# Patient Record
Sex: Male | Born: 1975 | Race: Black or African American | Hispanic: No | Marital: Single | Smoking: Current every day smoker
Health system: Southern US, Community
[De-identification: ages and names within clinical notes are randomized; demographics above are authoritative.]

---

## 2018-03-07 ENCOUNTER — Emergency Department (HOSPITAL_COMMUNITY): Payer: BLUE CROSS/BLUE SHIELD

## 2018-03-07 ENCOUNTER — Encounter (HOSPITAL_COMMUNITY): Payer: Self-pay | Admitting: Emergency Medicine

## 2018-03-07 ENCOUNTER — Inpatient Hospital Stay (HOSPITAL_COMMUNITY)
Admission: EM | Admit: 2018-03-07 | Discharge: 2018-03-18 | DRG: 164 | Disposition: A | Discharge status: Home / Self Care | Payer: BLUE CROSS/BLUE SHIELD | Attending: Thoracic Surgery (Cardiothoracic Vascular Surgery) | Admitting: Thoracic Surgery (Cardiothoracic Vascular Surgery)

## 2018-03-07 ENCOUNTER — Inpatient Hospital Stay (HOSPITAL_COMMUNITY): Payer: BLUE CROSS/BLUE SHIELD

## 2018-03-07 ENCOUNTER — Other Ambulatory Visit: Payer: Self-pay

## 2018-03-07 DIAGNOSIS — Z9689 Presence of other specified functional implants: Secondary | ICD-10-CM

## 2018-03-07 DIAGNOSIS — J438 Other emphysema: Principal | ICD-10-CM | POA: Diagnosis present

## 2018-03-07 DIAGNOSIS — J9811 Atelectasis: Secondary | ICD-10-CM | POA: Diagnosis present

## 2018-03-07 DIAGNOSIS — J9383 Other pneumothorax: Secondary | ICD-10-CM

## 2018-03-07 DIAGNOSIS — D62 Acute posthemorrhagic anemia: Secondary | ICD-10-CM | POA: Diagnosis not present

## 2018-03-07 DIAGNOSIS — Z09 Encounter for follow-up examination after completed treatment for conditions other than malignant neoplasm: Secondary | ICD-10-CM

## 2018-03-07 DIAGNOSIS — J939 Pneumothorax, unspecified: Secondary | ICD-10-CM

## 2018-03-07 DIAGNOSIS — J9312 Secondary spontaneous pneumothorax: Secondary | ICD-10-CM | POA: Diagnosis present

## 2018-03-07 DIAGNOSIS — F17210 Nicotine dependence, cigarettes, uncomplicated: Secondary | ICD-10-CM | POA: Diagnosis present

## 2018-03-07 DIAGNOSIS — Z4682 Encounter for fitting and adjustment of non-vascular catheter: Secondary | ICD-10-CM

## 2018-03-07 DIAGNOSIS — J9311 Primary spontaneous pneumothorax: Secondary | ICD-10-CM | POA: Diagnosis not present

## 2018-03-07 DIAGNOSIS — J9382 Other air leak: Secondary | ICD-10-CM | POA: Diagnosis not present

## 2018-03-07 DIAGNOSIS — J439 Emphysema, unspecified: Secondary | ICD-10-CM | POA: Diagnosis present

## 2018-03-07 DIAGNOSIS — Z902 Acquired absence of lung [part of]: Secondary | ICD-10-CM

## 2018-03-07 LAB — BASIC METABOLIC PANEL
Anion gap: 11 (ref 5–15)
BUN: 11 mg/dL (ref 6–20)
CHLORIDE: 107 mmol/L (ref 98–111)
CO2: 25 mmol/L (ref 22–32)
CREATININE: 1.06 mg/dL (ref 0.61–1.24)
Calcium: 9.1 mg/dL (ref 8.9–10.3)
Glucose, Bld: 86 mg/dL (ref 70–99)
POTASSIUM: 4 mmol/L (ref 3.5–5.1)
Sodium: 143 mmol/L (ref 135–145)

## 2018-03-07 LAB — CBC
HEMATOCRIT: 42.8 % (ref 39.0–52.0)
Hemoglobin: 14.4 g/dL (ref 13.0–17.0)
MCH: 31.4 pg (ref 26.0–34.0)
MCHC: 33.6 g/dL (ref 30.0–36.0)
MCV: 93.4 fL (ref 78.0–100.0)
PLATELETS: 294 10*3/uL (ref 150–400)
RBC: 4.58 MIL/uL (ref 4.22–5.81)
RDW: 13.3 % (ref 11.5–15.5)
WBC: 6.2 10*3/uL (ref 4.0–10.5)

## 2018-03-07 LAB — I-STAT TROPONIN, ED: Troponin i, poc: 0 ng/mL (ref 0.00–0.08)

## 2018-03-07 MED ORDER — LIDOCAINE HCL (PF) 1 % IJ SOLN
INTRAMUSCULAR | Status: AC
  Administered 2018-03-07: 30 mL via INTRADERMAL
  Filled 2018-03-07: qty 30

## 2018-03-07 MED ORDER — LIDOCAINE HCL (PF) 1 % IJ SOLN
10.0000 mL | Freq: Once | INTRAMUSCULAR | Status: DC
  Administered 2018-03-07: 30 mL via INTRADERMAL

## 2018-03-07 MED ORDER — OXYCODONE HCL 5 MG PO TABS
5.0000 mg | ORAL_TABLET | ORAL | Status: DC | PRN
  Administered 2018-03-08 (×2): 10 mg via ORAL
  Administered 2018-03-08: 5 mg via ORAL
  Administered 2018-03-09 (×4): 10 mg via ORAL
  Filled 2018-03-07: qty 1
  Filled 2018-03-07 (×7): qty 2

## 2018-03-07 MED ORDER — ENOXAPARIN SODIUM 40 MG/0.4ML ~~LOC~~ SOLN
40.0000 mg | SUBCUTANEOUS | Status: DC
  Administered 2018-03-07 – 2018-03-08 (×2): 40 mg via SUBCUTANEOUS
  Filled 2018-03-07 (×2): qty 0.4

## 2018-03-07 MED ORDER — ACETAMINOPHEN 500 MG PO TABS
1000.0000 mg | ORAL_TABLET | Freq: Four times a day (QID) | ORAL | Status: AC
  Administered 2018-03-07 – 2018-03-09 (×8): 1000 mg via ORAL
  Filled 2018-03-07 (×8): qty 2

## 2018-03-07 MED ORDER — MORPHINE SULFATE (PF) 4 MG/ML IV SOLN
4.0000 mg | Freq: Once | INTRAVENOUS | Status: AC
  Administered 2018-03-07: 4 mg via INTRAVENOUS

## 2018-03-07 MED ORDER — MORPHINE SULFATE (PF) 2 MG/ML IV SOLN
2.0000 mg | INTRAVENOUS | Status: DC | PRN
  Administered 2018-03-07: 2 mg via INTRAVENOUS
  Administered 2018-03-07: 4 mg via INTRAVENOUS
  Administered 2018-03-08: 2 mg via INTRAVENOUS
  Administered 2018-03-08: 4 mg via INTRAVENOUS
  Administered 2018-03-08: 2 mg via INTRAVENOUS
  Administered 2018-03-09 (×2): 4 mg via INTRAVENOUS
  Filled 2018-03-07 (×3): qty 2
  Filled 2018-03-07: qty 1
  Filled 2018-03-07: qty 2
  Filled 2018-03-07 (×2): qty 1

## 2018-03-07 MED ORDER — MORPHINE SULFATE (PF) 4 MG/ML IV SOLN
INTRAVENOUS | Status: AC
  Filled 2018-03-07: qty 1

## 2018-03-07 NOTE — ED Notes (Signed)
Dr. Dorris FetchHendrickson at bedside to place chest tube.  PORT CXR being obtained at this time

## 2018-03-07 NOTE — Procedures (Signed)
Informed consent obtained Sterile technique used Time out performed 15 ml 1% lidocaine local anesthesia 14 F pigtail catheter placed right pleural space Tolerated well + air leak  Troy SpareSteven C. Dorris FetchHendrickson, MD [facility] [phone]

## 2018-03-07 NOTE — ED Notes (Signed)
CVTS at bedside  

## 2018-03-07 NOTE — H&P (Signed)
Troy Farley is an 42 y.o. male.   Chief Complaint: CP and shortness of breath HPI: 42 yo man with no significant medical history who presents with CP and SOB x 2 days. He does smoke 5-6 cigarettes a day. No history of COPD or asthma. He noted sudden onset of right sided CP Saturday night, accompanied by shortness of breath. He tried to "tough it out" and yesterday symptoms were a little better. Today CP about the same but worsening shortness of breath.  History reviewed. No pertinent past medical history.  History reviewed. No pertinent surgical history.  History reviewed. No pertinent family history. Social History:  reports that he has been smoking cigarettes. He has been smoking about 0.25 packs per day. He has never used smokeless tobacco. His alcohol and drug histories are not on file.  Allergies: Not on File  MEDICATIONS: None prior to admission  Results for orders placed or performed during the hospital encounter of 03/07/18 (from the past 48 hour(s))  Basic metabolic panel     Status: None   Collection Time: 03/07/18 12:19 PM  Result Value Ref Range   Sodium 143 135 - 145 mmol/L   Potassium 4.0 3.5 - 5.1 mmol/L   Chloride 107 98 - 111 mmol/L   CO2 25 22 - 32 mmol/L   Glucose, Bld 86 70 - 99 mg/dL   BUN 11 6 - 20 mg/dL   Creatinine, Ser 1.06 0.61 - 1.24 mg/dL   Calcium 9.1 8.9 - 10.3 mg/dL   GFR calc non Af Amer >60 >60 mL/min   GFR calc Af Amer >60 >60 mL/min    Comment: (NOTE) The eGFR has been calculated using the CKD EPI equation. This calculation has not been validated in all clinical situations. eGFR's persistently <60 mL/min signify possible Chronic Kidney Disease.    Anion gap 11 5 - 15    Comment: Performed at [facility] [address], [place] [address]  CBC     Status: None   Collection Time: 03/07/18 12:19 PM  Result Value Ref Range   WBC 6.2 4.0 - 10.5 K/uL   RBC 4.58 4.22 - 5.81 MIL/uL   Hemoglobin 14.4 13.0 - 17.0 g/dL   HCT 42.8  39.0 - 52.0 %   MCV 93.4 78.0 - 100.0 fL   MCH 31.4 26.0 - 34.0 pg   MCHC 33.6 30.0 - 36.0 g/dL   RDW 13.3 11.5 - 15.5 %   Platelets 294 150 - 400 K/uL    Comment: Performed at [facility] [address] [place], [place] [address]  I-stat troponin, ED     Status: None   Collection Time: 03/07/18 12:23 PM  Result Value Ref Range   Troponin i, poc 0.00 0.00 - 0.08 ng/mL   Comment 3            Comment: Due to the release kinetics of cTnI, a negative result within the first hours of the onset of symptoms does not rule out myocardial infarction with certainty. If myocardial infarction is still suspected, repeat the test at appropriate intervals.    Dg Chest 2 View  Result Date: 03/07/2018 CLINICAL DATA:  Cough with chest pain and shortness of breath EXAM: CHEST - 2 VIEW COMPARISON:  None. FINDINGS: There is a sizable pneumothorax along the inferior and lateral aspect of the right hemithorax. There is extensive bullous disease on the left. Lucency in the upper lung region on the right likely is due  to bullous disease. There may be pneumothorax as well as bullous disease superimposed on the right. There appears to be mild tension component on the right. The left lung is clear. Heart size is normal. Pulmonary vascularity on the left is normal. Pulmonary vascularity on the right is distorted. No adenopathy. No evident bone lesions. IMPRESSION: Pneumothorax on the right, most notably in the right inferior and lateral regions with apparent mild tension component. There is evidence of bullous disease on the right with lucency in the right upper lobe largely if not entirely due to bullous disease. Given the presence of pneumothorax and extensive bullous disease, it may be prudent to consider noncontrast enhanced chest CT to further evaluate, prior to chest tube placement. Left lung clear.  Heart size normal. Critical Value/emergent results were called by telephone at the time of interpretation on  03/07/2018 at 1:23 pm to Dr. Davonna Belling , who verbally acknowledged these results. Electronically Signed   By: Lowella Grip III M.D.   On: 03/07/2018 13:23   I personally reviewed the CXR and concur with the findings noted above  Review of Systems  Constitutional: Negative for chills and fever.  Respiratory: Positive for shortness of breath. Negative for cough, hemoptysis and wheezing.   Cardiovascular: Positive for chest pain. Negative for orthopnea.  All other systems reviewed and are negative.   Blood pressure 124/86, pulse 64, temperature 98.4 F (36.9 C), temperature source Oral, resp. rate 19, SpO2 100 %. Physical Exam  Vitals reviewed. Constitutional: He is oriented to person, place, and time. He appears well-developed and well-nourished. No distress.  HENT:  Head: Normocephalic and atraumatic.  Mouth/Throat: No oropharyngeal exudate.  Eyes: Conjunctivae and EOM are normal. No scleral icterus.  Neck: No thyromegaly present.  Cardiovascular: Normal rate, regular rhythm and normal heart sounds. Exam reveals no gallop and no friction rub.  No murmur heard. Respiratory: No respiratory distress.  Absent BS on right  GI: Soft. He exhibits no distension. There is no tenderness.  Musculoskeletal: He exhibits no edema or deformity.  Lymphadenopathy:    He has no cervical adenopathy.  Neurological: He is alert and oriented to person, place, and time. No cranial nerve deficit. He exhibits normal muscle tone. Coordination normal.  Skin: Skin is warm and dry.     Assessment/Plan 42 yo man with no significant past history other than light tobacco use who presents with a 2 day history of right sided CP and shortness of breath. He has a large right spontaneous pneumothorax. 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   Will repeat CXR after lung reexpanded, if still a suspicion  for giant bulla will obtain chest CT.  Pain control with PCA  I discussed chest tube placement with Mr. Pustejovsky. He understands the risks include but are not limited bleeding, infection and tube malposition. He accepts the risks and agrees to proceed.  Melrose Nakayama, MD 03/07/2018, 2:52 PM

## 2018-03-07 NOTE — ED Notes (Signed)
Pt remains in CT

## 2018-03-07 NOTE — ED Notes (Signed)
Dinner tray ordered for pt

## 2018-03-07 NOTE — ED Provider Notes (Signed)
[facility] EMERGENCY DEPARTMENT Provider Note   CSN: [id number] Arrival date & time: 03/07/18  1206     History   Chief Complaint Chief Complaint  Patient presents with  . Chest Pain    HPI Troy Farley is a 42 y.o. male.  HPI Patient presents with chest pain.  Began 2 days ago on the right side while he was listening to some music.  Now with worsening shortness of breath and pain.  Tight on the right side.  No trauma.  He does smoke cigarettes.  No fevers.  Pain is dull. History reviewed. No pertinent past medical history.  There are no active problems to display for this patient.   History reviewed. No pertinent surgical history.      Home Medications    Prior to Admission medications   Not on File    Family History History reviewed. No pertinent family history.  Social History Social History   Tobacco Use  . Smoking status: Current Every Day Smoker    Packs/day: 0.25    Types: Cigarettes  . Smokeless tobacco: Never Used  Substance Use Topics  . Alcohol use: Not on file  . Drug use: Not on file     Allergies   Patient has no allergy information on record.   Review of Systems Review of Systems  Constitutional: Negative for appetite change.  HENT: Negative for congestion.   Respiratory: Positive for shortness of breath.   Cardiovascular: Positive for chest pain.  Gastrointestinal: Negative for abdominal pain.  Genitourinary: Negative for flank pain.  Musculoskeletal: Negative for back pain.  Neurological: Negative for weakness.  Hematological: Negative for adenopathy.  Psychiatric/Behavioral: Negative for confusion.     Physical Exam Updated Vital Signs BP (!) 125/91 (BP Location: Right Arm)   Pulse 91   Temp 98.4 F (36.9 C) (Oral)   Resp 18   SpO2 100%   Physical Exam  Constitutional: He appears well-developed.  HENT:  Head: Normocephalic.  Neck: Neck supple. No tracheal deviation present.  Cardiovascular:  Regular rhythm.  Pulmonary/Chest:  Decreased breath sounds on the right.  Abdominal: There is no tenderness.  Musculoskeletal:       Right lower leg: He exhibits no edema.       Left lower leg: He exhibits no edema.  Neurological: He is alert.  Skin: Skin is warm. Capillary refill takes less than 2 seconds.  Psychiatric: He has a normal mood and affect.     ED Treatments / Results  Labs (all labs ordered are listed, but only abnormal results are displayed) Labs Reviewed  BASIC METABOLIC PANEL  CBC  I-STAT TROPONIN, ED    EKG EKG Interpretation  Date/Time:  Monday March 07 2018 12:12:14 EDT Ventricular Rate:  83 PR Interval:  140 QRS Duration: 100 QT Interval:  356 QTC Calculation: 418 R Axis:   61 Text Interpretation:  Normal sinus rhythm Incomplete right bundle branch block Nonspecific ST and T wave abnormality Abnormal ECG Confirmed by Benjiman Core [phone]) on 03/07/2018 1:56:00 PM   Radiology Dg Chest 2 View  Result Date: 03/07/2018 CLINICAL DATA:  Cough with chest pain and shortness of breath EXAM: CHEST - 2 VIEW COMPARISON:  None. FINDINGS: There is a sizable pneumothorax along the inferior and lateral aspect of the right hemithorax. There is extensive bullous disease on the left. Lucency in the upper lung region on the right likely is due to bullous disease. There may be pneumothorax as well as bullous disease superimposed  on the right. There appears to be mild tension component on the right. The left lung is clear. Heart size is normal. Pulmonary vascularity on the left is normal. Pulmonary vascularity on the right is distorted. No adenopathy. No evident bone lesions. IMPRESSION: Pneumothorax on the right, most notably in the right inferior and lateral regions with apparent mild tension component. There is evidence of bullous disease on the right with lucency in the right upper lobe largely if not entirely due to bullous disease. Given the presence of pneumothorax  and extensive bullous disease, it may be prudent to consider noncontrast enhanced chest CT to further evaluate, prior to chest tube placement. Left lung clear.  Heart size normal. Critical Value/emergent results were called by telephone at the time of interpretation on 03/07/2018 at 1:23 pm to Dr. Benjiman CoreNATHAN [name] , who verbally acknowledged these results. Electronically Signed   By: Bretta BangWilliam  Woodruff III M.D.   On: 03/07/2018 13:23    Procedures Procedures (including critical care time)  Medications Ordered in ED Medications - No data to display   Initial Impression / Assessment and Plan / ED Course  I have reviewed the triage vital signs and the nursing notes.  Pertinent labs & imaging results that were available during my care of the patient were reviewed by me and considered in my medical decision making (see chart for details).     Patient is pneumothorax on right side and also with large bulla.  X-ray questioned tension but clinically not tensioning.  Seen in the ER by Dr. Dorris FetchHendrickson.  Final Clinical Impressions(s) / ED Diagnoses   Final diagnoses:  Pneumothorax, unspecified type    ED Discharge Orders    None       Benjiman CorePickering, [name], MD 03/07/18 1447

## 2018-03-07 NOTE — ED Triage Notes (Signed)
Pt to ER for evaluation of right upper chest pain onset Saturday. Pt reports Sunday it went away and today has returned, states was "putting in windows when it started." Tender on palpation and reproducible with movement. Pt does report exertional shortness of breath. In NAD at this time, states he does not have any pain right now but that it feels like "something is clogging his throat."

## 2018-03-07 NOTE — ED Notes (Signed)
Pt back from CT

## 2018-03-08 NOTE — Progress Notes (Signed)
Patient educated on how to use the incentive spirometer and the need for him to ambulate in the hall, patient verbalized understanding but refused to ambulate in the hall, will continue to reinforce teachings

## 2018-03-08 NOTE — Plan of Care (Signed)

## 2018-03-08 NOTE — Progress Notes (Signed)
Pt admit to 4E16 around 2020 alert and oriented times 4. R sided chest tube to water seal. No skin issues. Ambulatory-pain of 10/10  Upon arrival. He has been oriented to room and his call light.

## 2018-03-08 NOTE — Progress Notes (Signed)
HR got to 45 will continue to monitor.

## 2018-03-08 NOTE — Progress Notes (Addendum)
      [address]       [place] [id number]             [phone]           Subjective: Patient has pain with deep inspiration  Objective: Vital signs in last 24 hours: Temp:  [98.1 F (36.7 C)-98.4 F (36.9 C)] 98.2 F (36.8 C) (09/17 0338) Pulse Rate:  [54-91] 54 (09/16 2023) Cardiac Rhythm: Sinus bradycardia (09/17 0438) Resp:  [18-20] 20 (09/16 2023) BP: (110-125)/(79-91) 110/79 (09/16 2023) SpO2:  [97 %-100 %] 97 % (09/16 2023) Weight:  [64.1 kg] 64.1 kg (09/16 2023)  Intake/Output from previous day: 09/16 0701 - 09/17 0700 In: -  Out: 350 [Urine:350]   Physical Exam:  Cardiovascular: Bradycardic Pulmonary: Clear on the left and somewhat coarse on the right Chest Tube: to water seal, no air leak  Lab Results: CBC: Recent Labs    03/07/18 1219  WBC 6.2  HGB 14.4  HCT 42.8  PLT 294   BMET:  Recent Labs    03/07/18 1219  NA 143  K 4.0  CL 107  CO2 25  GLUCOSE 86  BUN 11  CREATININE 1.06  CALCIUM 9.1    PT/INR: No results for input(s): LABPROT, INR in the last 72 hours. ABG:  INR: Will add last result for INR, ABG once components are confirmed Will add last 4 CBG results once components are confirmed  Assessment/Plan:  1. CV - SB. 2.  Pulmonary - Chest tube is to water seal. There is no air leak.  No CXR ordered. CT yesterday showed approximately 5% right basilar pneumothorax adjacent to the pleural drainage catheter. 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 Emphysema. Complete atelectasis of the right middle lobe.There are about 13 scattered pulmonary nodules, mostly subpleural in location, measuring up to 8 mm in diameter. Will check CXR in am.  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 Some atelectasis/  consolidation of RLL. In my opinion needs Right VATS for removal of giant bullae. I discussed the procedure with him. He understands the general nature of the procedure including the need for GA and the incisions to be used, and the use of a chest tube postoperatively. I informed him of the indications, risks, benefits and alternatives. He understands the risks include but are not limited to death, MI, DVT, PE, bleeding, possible need for transfusion, infection, prolonged air leak, as well as the possibility of other unforeseeable complications. He accepts the risks and agrees to proceed.  Plan OR Thursday 9/19  Salvatore DecentSteven C. Dorris FetchHendrickson, MD [facility] [phone]

## 2018-03-09 ENCOUNTER — Inpatient Hospital Stay (HOSPITAL_COMMUNITY): Payer: BLUE CROSS/BLUE SHIELD

## 2018-03-09 LAB — BLOOD GAS, ARTERIAL
Acid-Base Excess: 2.4 mmol/L — ABNORMAL HIGH (ref 0.0–2.0)
Bicarbonate: 26.5 mmol/L (ref 20.0–28.0)
FIO2: 0.21
O2 Saturation: 94.9 %
Patient temperature: 98.6
pCO2 arterial: 42 mmHg (ref 32.0–48.0)
pH, Arterial: 7.417 (ref 7.350–7.450)
pO2, Arterial: 76.1 mmHg — ABNORMAL LOW (ref 83.0–108.0)

## 2018-03-09 LAB — CBC
HCT: 42.2 % (ref 39.0–52.0)
Hemoglobin: 14.4 g/dL (ref 13.0–17.0)
MCH: 31.2 pg (ref 26.0–34.0)
MCHC: 34.1 g/dL (ref 30.0–36.0)
MCV: 91.5 fL (ref 78.0–100.0)
Platelets: 291 10*3/uL (ref 150–400)
RBC: 4.61 MIL/uL (ref 4.22–5.81)
RDW: 13.1 % (ref 11.5–15.5)
WBC: 4.3 10*3/uL (ref 4.0–10.5)

## 2018-03-09 LAB — APTT: APTT: 32 s (ref 24–36)

## 2018-03-09 LAB — URINALYSIS, ROUTINE W REFLEX MICROSCOPIC
BILIRUBIN URINE: NEGATIVE
GLUCOSE, UA: NEGATIVE mg/dL
HGB URINE DIPSTICK: NEGATIVE
KETONES UR: NEGATIVE mg/dL
Leukocytes, UA: NEGATIVE
Nitrite: NEGATIVE
PROTEIN: NEGATIVE mg/dL
Specific Gravity, Urine: 1.017 (ref 1.005–1.030)
pH: 6 (ref 5.0–8.0)

## 2018-03-09 LAB — ABO/RH: ABO/RH(D): A POS

## 2018-03-09 LAB — COMPREHENSIVE METABOLIC PANEL
ALT: 13 U/L (ref 0–44)
AST: 17 U/L (ref 15–41)
Albumin: 3.2 g/dL — ABNORMAL LOW (ref 3.5–5.0)
Alkaline Phosphatase: 69 U/L (ref 38–126)
Anion gap: 10 (ref 5–15)
BUN: 16 mg/dL (ref 6–20)
CHLORIDE: 102 mmol/L (ref 98–111)
CO2: 24 mmol/L (ref 22–32)
CREATININE: 1.01 mg/dL (ref 0.61–1.24)
Calcium: 8.7 mg/dL — ABNORMAL LOW (ref 8.9–10.3)
GFR calc non Af Amer: >60 mL/min (ref >60)
Glucose, Bld: 95 mg/dL (ref 70–99)
POTASSIUM: 4 mmol/L (ref 3.5–5.1)
SODIUM: 136 mmol/L (ref 135–145)
TOTAL PROTEIN: 5.8 g/dL — AB (ref 6.5–8.1)
Total Bilirubin: 0.5 mg/dL (ref 0.3–1.2)

## 2018-03-09 LAB — SURGICAL PCR SCREEN
MRSA, PCR: NEGATIVE
STAPHYLOCOCCUS AUREUS: POSITIVE — AB

## 2018-03-09 LAB — PROTIME-INR
INR: 0.93
PROTHROMBIN TIME: 12.3 s (ref 11.4–15.2)

## 2018-03-09 MED ORDER — MUPIROCIN 2 % EX OINT
1.0000 "application " | TOPICAL_OINTMENT | Freq: Two times a day (BID) | CUTANEOUS | Status: DC
  Administered 2018-03-09 (×2): 1 via NASAL
  Filled 2018-03-09: qty 22

## 2018-03-09 MED ORDER — CHLORHEXIDINE GLUCONATE CLOTH 2 % EX PADS
6.0000 | MEDICATED_PAD | Freq: Every day | CUTANEOUS | Status: DC
  Administered 2018-03-09: 6 via TOPICAL

## 2018-03-09 MED ORDER — SODIUM CHLORIDE 0.9 % IV SOLN
1.5000 g | INTRAVENOUS | Status: AC
  Administered 2018-03-10: 1.5 g via INTRAVENOUS
  Filled 2018-03-09: qty 1.5

## 2018-03-09 NOTE — Discharge Summary (Signed)
Physician Discharge Summary       [address]       [place] [address]             [phone]    Patient ID: Troy Farley MRN: [id number] DOB/AGE: 1975-08-27 42 y.o.  Admit date: 03/07/2018 Discharge date: 03/18/2018  Admission Diagnoses: Spontaneous right pneumothorax  Discharge Diagnoses:  1. 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 History of tobacco abuse  Procedure (s):  1. 14 French pigtail catheter placed in the right pleural space 2. Right video-assisted thoracoscopy, right upper lobectomy, intercostal nerve block by Dr. Dorris Fetch on 03/10/2018.  History of Presenting Illness: This is a 42 yo man with no significant medical history who presents with chest pain and shortness of breath x 2 days. He does smoke 5-6 cigarettes a day. No history of COPD or asthma. He noted sudden onset of right sided CP Saturday night, accompanied by shortness of breath. He tried to "tough it out" and yesterday symptoms were a little better. On 09/16, his pain was about the same but worsening shortness of breath. Chest x ray showed pneumothorax on the right, most notably in the right inferior and lateral regions with apparent mild tension component. There is evidence of bullous disease on the right with lucency in the right upper lobe largely if not entirely due to bullous disease. Dr. Dorris Fetch placed a 14 French pigtail cathter on the right. Follow up chest x ray showed minimal residual right basilar pneumothorax  and severe bleb formation in the right lung.  Brief Hospital Course:  Chest tube has been to water seal. Initially, there was an air leak but this did resolve. He has remained afebrile and hemodynamically stable. 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 Dr. Dorris Fetch felt patient needed right VATS for removal of giant bullae. Dr. Dorris Fetch discussed the procedure with him. Patient understands the general nature of the procedure including the need for GA and the incisions to be used, and the use of a chest tube postoperatively. Dr. Dorris Fetch informed him of the indications, risks, benefits and alternatives. Patient was willing to proceed with surgery. He underwent Right video-assisted thoracoscopy, right upper lobectomy, intercostal nerve block. on 03/09/2018.  He tolerated the procedure without difficulty, was extubated and taken to the PACU in stable condition.  He did well post operatively.  His arterial line was removed without difficulty.  He was started on Lovenox for DVT prophylaxis.  His chest tube showed evidence of an air leak and was left on suction.  Chest tubes were placed to water seal on 09/22. CXR then showed a 30-40% right pneumothorax. Posterior chest tube was removed on 09/23. He continued to have an air leak with cough. Chest tube remained to water seal. On 09/26, there was no air leak. Chest tube was clamped. Follow up chest x ray showed a stable right pneumothorax and tiny right pleural effusion . Chest tube was removed on 03/17/2018. Chest x ray this am showed questionable slight increase in right pneumothorax. Dr. Dorris Fetch reviewed the chest x ray and stated it was stable. He is ambulating on room air. He is tolerating a diet and has had a bowel movement. His wounds are clean and dry. As discussed with Dr. Dorris Fetch, ok to discharge.  Latest Vital Signs: Blood pressure 109/72,  pulse 98, temperature 97.7 F (36.5 C), temperature source Oral, resp. rate 16, height 5\' 10"  (1.778 m), weight 64.1 kg, SpO2 98 %.  Physical Exam: Cardiovascular: RRR Pulmonary: Clear to auscultation bilaterally Abdomen: Soft, non tender, bowel sounds present. Extremities: No lower extremity edema. Wound: Clean and dry.    Discharge  Condition: Stable and discharged to home.  Recent laboratory studies:  Lab Results  Component Value Date   WBC 4.9 03/14/2018   HGB 7.9 (L) 03/14/2018   HCT 24.0 (L) 03/14/2018   MCV 94.5 03/14/2018   PLT 320 03/14/2018   Lab Results  Component Value Date   NA 136 03/14/2018   K 3.9 03/14/2018   CL 98 03/14/2018   CO2 29 03/14/2018   CREATININE 0.95 03/14/2018   GLUCOSE 109 (H) 03/14/2018      Diagnostic Studies: Dg Chest 2 View  Result Date: 03/18/2018 CLINICAL DATA:  Status post chest tube removal EXAM: CHEST - 2 VIEW COMPARISON:  03/17/2018 FINDINGS: The previously seen right-sided pneumothorax has increased somewhat in the interval from the prior exam. Left lung remains clear. Postsurgical changes are again noted. The cardiac shadow is stable. No bony abnormality is seen. IMPRESSION: Slight increase in right-sided pneumothorax when compared with the previous day. These results will be called to the ordering clinician or representative by the Radiologist Assistant, and communication documented in the PACS or zVision Dashboard. Electronically Signed   By: Alcide CleverMark  Lukens M.D.   On: 03/18/2018 09:38   Dg Chest 2 View  Result Date: 03/07/2018 CLINICAL DATA:  Cough with chest pain and shortness of breath EXAM: CHEST - 2 VIEW COMPARISON:  None. FINDINGS: There is a sizable pneumothorax along the inferior and lateral aspect of the right hemithorax. There is extensive bullous disease on the left. Lucency in the upper lung region on the right likely is due to bullous disease. There may be pneumothorax as well as bullous disease superimposed on the right. There appears to be mild tension component on the right. The left lung is clear. Heart size is normal. Pulmonary vascularity on the left is normal. Pulmonary vascularity on the right is distorted. No adenopathy. No evident bone lesions. IMPRESSION: Pneumothorax on the right, most notably in the right inferior and lateral regions with apparent  mild tension component. There is evidence of bullous disease on the right with lucency in the right upper lobe largely if not entirely due to bullous disease. Given the presence of pneumothorax and extensive bullous disease, it may be prudent to consider noncontrast enhanced chest CT to further evaluate, prior to chest tube placement. Left lung clear.  Heart size normal. Critical Value/emergent results were called by telephone at the time of interpretation on 03/07/2018 at 1:23 pm to Dr. Benjiman CoreNATHAN PICKERING , who verbally acknowledged these results. Electronically Signed   By: Bretta BangWilliam  Woodruff III M.D.   On: 03/07/2018 13:23   Ct Chest Wo Contrast  Result Date: 03/07/2018 CLINICAL DATA:  Pneumothorax, chest tube in place EXAM: CT CHEST WITHOUT CONTRAST TECHNIQUE: Multidetector CT imaging of the chest was performed following the standard protocol without IV contrast. COMPARISON:  03/07/2018 chest radiograph FINDINGS: Cardiovascular: Unremarkable Mediastinum/Nodes: Unremarkable Lungs/Pleura: Striking primarily paraseptal emphysema, with a large bulla on the right particularly at the right lung apex encompassing most of the right hemithoracic volume. There is a right pleural drainage catheter which is well positioned peripherally at the right lung base and associated with a approximately 5% right basilar pneumothorax. There is complete atelectasis of the  right middle lobe and subtotal atelectasis of the right lower lobe, potentially with some degree of consolidation also in the right lower lobe. I do not see a central obstructing mass along the tracheobronchial tree. There is also some mild atelectasis or scarring medially in the right upper lobe. There are approximately 13 scattered pulmonary nodules, most of which are subpleural in location. An index nodule in the left lower lobe measures 0.8 by 0.7 cm; most of the nodules are smaller. Upper Abdomen: Unremarkable Musculoskeletal: Unremarkable IMPRESSION: 1. There is  an approximately 5% right basilar pneumothorax adjacent to the pleural drainage catheter. 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 Emphysema (ICD10-J43.9). 2. Complete atelectasis of the right middle lobe. Airspace opacity in most of the right lower lobe, likely a combination of consolidation and atelectasis. No obvious central obstructing mass to account for these findings. 3. There are about 13 scattered pulmonary nodules, mostly subpleural in location, measuring up to 8 mm in diameter. Non-contrast chest CT at 3-6 months is recommended. If the nodules are stable at time of repeat CT, then future CT at 18-24 months (from today's scan) is considered optional for low-risk patients, but is recommended for high-risk patients. This recommendation follows the consensus statement: Guidelines for Management of Incidental Pulmonary Nodules Detected on CT Images: From the Fleischner Society 2017; Radiology 2017; 284:228-243. Electronically Signed   By: Gaylyn Rong M.D.   On: 03/07/2018 19:34   Dg Chest 1v Repeat Same Day  Addendum Date: 03/17/2018   ADDENDUM REPORT: 03/17/2018 15:09 ADDENDUM: There is a voice recognition error in the impression above. The first statement in the impression should read: "slight interval increase in the volume of the pneumothorax on the right which amounts to approximately 10 percent of the pleural space volume". Electronically Signed   By: David  [place] M.D.   On: 03/17/2018 15:09   Result Date: 03/17/2018 CLINICAL DATA:  Follow-up of right-sided pneumothorax with chest tube treatment EXAM: CHEST - 1 VIEW SAME DAY COMPARISON:  Portable chest x-ray of 7:09 a.m. today. FINDINGS: The right pneumothorax has increased slightly in size. There is a small right pleural effusion. The right chest tube tip projects over the medial aspect of the third rib. There is no mediastinal shift. The left lung is well-expanded and clear. There is stable soft tissue fullness in  the right paratracheal region with surgical clips and surgical suture present in the adjacent lung. The heart and pulmonary vascularity are normal. The bony thorax exhibits no acute abnormality. IMPRESSION: Slight interval increase in the volume of the pneumothorax on the right since in mouth to approximately 10-15% of the lung volume. Small right pleural effusion. Electronically Signed: By: David  [place] M.D. On: 03/17/2018 12:53   Dg Chest 1v Repeat Same Day  Result Date: 03/17/2018 CLINICAL DATA:  Chest tube removal. EXAM: CHEST - 1 VIEW SAME DAY COMPARISON:  03/17/2018. FINDINGS: Postsurgical changes noted of the right chest. Interval removal of right chest tube. Stable right-sided pneumothorax. No acute infiltrate. Tiny stable pleural effusion. Heart size stable. No acute bony abnormality. IMPRESSION: Postsurgical changes right chest with interval removal of right chest tube. Stable right-sided pneumothorax. Tiny stable right pleural effusion. Electronically Signed   By: Maisie Fus  Register   On: 03/17/2018 14:46   Dg Chest Port 1 View  Result Date: 03/17/2018 CLINICAL DATA:  Chest tube, pneumothorax. EXAM: PORTABLE CHEST 1 VIEW COMPARISON:  Radiograph of March 16, 2018. FINDINGS: Stable cardiomediastinal silhouette with postsurgical changes seen in right paratracheal region.  Right-sided chest tube is unchanged in position. Mild right apical pneumothorax is noted which is improved compared to prior exam. Left lung is clear. The visualized skeletal structures are unremarkable. IMPRESSION: Mild right apical pneumothorax is noted which is improved compared to prior exam. Electronically Signed   By: Lupita Raider, M.D.   On: 03/17/2018 09:40   Dg Chest Port 1 View  Result Date: 03/16/2018 CLINICAL DATA:  Follow-up right pneumothorax and chest tube. EXAM: PORTABLE CHEST 1 VIEW COMPARISON:  Yesterday. FINDINGS: A right chest tube remains in place. A right apical pneumothorax is again demonstrated  without significant change. Right postsurgical changes are again noted. Clear left lung with left apical bullous changes again demonstrated. Normal sized heart. Mild scoliosis. IMPRESSION: Stable approximately 20% right apical pneumothorax with a chest tube in place. Electronically Signed   By: Beckie Salts M.D.   On: 03/16/2018 11:11   Dg Chest Port 1 View  Result Date: 03/15/2018 CLINICAL DATA:  Chest tube. EXAM: PORTABLE CHEST 1 VIEW COMPARISON:  03/14/2018. FINDINGS: Right chest tube in stable position. Right-sided pneumothorax is again noted with slight improvement from prior exam. Postsurgical changes about the right lung again noted. No acute infiltrate. No pleural effusion or pneumothorax. Heart size normal. IMPRESSION: 1. Right chest tube in stable position. Right-sided pneumothorax is again noted with slight improvement from prior exam. 2.  Postsurgical changes right lung again noted. Electronically Signed   By: Maisie Fus  Register   On: 03/15/2018 10:34   Dg Chest Port 1 View  Result Date: 03/14/2018 CLINICAL DATA:  Chest tube in place.  Chest soreness. EXAM: PORTABLE CHEST 1 VIEW COMPARISON:  Nine hundred twenty-two T FINDINGS: 2 right chest tubes are unchanged in position. Interval development of a moderate right-sided pneumothorax, with the visceral pleural line maximally 4.9 cm from chest wall. No mediastinal shift. Normal heart size. No pleural fluid. Right hemidiaphragm elevation. Surgical changes in the right hilum and perihilar region. IMPRESSION: Right chest tubes in place, with development of a moderate, approximately 30-40% right pneumothorax. These results will be called to the ordering clinician or representative by the Radiologist Assistant, and communication documented in the PACS or zVision Dashboard. Electronically Signed   By: Jeronimo Greaves M.D.   On: 03/14/2018 07:54   Dg Chest Port 1 View  Result Date: 03/13/2018 CLINICAL DATA:  Pneumothorax EXAM: PORTABLE CHEST 1 VIEW  COMPARISON:  03/12/2018 FINDINGS: Postsurgical changes in the right hemithorax. Two right chest tubes are present. No pneumothorax is seen. Left lung is clear.  No pleural effusion. The heart is normal in size. IMPRESSION: Postsurgical changes in the right hemithorax. No pneumothorax is seen. Two right chest tubes are present. Electronically Signed   By: Charline Bills M.D.   On: 03/13/2018 07:43   Dg Chest Port 1 View  Result Date: 03/12/2018 CLINICAL DATA:  42 year old male postoperative day 2 status post right VATS, right upper lobectomy and excision of bulla for treatment of spontaneous pneumothorax. EXAM: PORTABLE CHEST 1 VIEW COMPARISON:  03/11/2018 and earlier. FINDINGS: Portable AP semi upright view at 0532 hours. 2 right chest tubes are stable. Postoperative changes to the right hilum and upper lung. Asymmetric lucency in the right lung, including at the right costophrenic angle lucency. Stable lung volumes. No pulmonary edema, pleural effusion or confluent pulmonary opacity. Stable cardiac size and mediastinal contours. No acute osseous abnormality identified. IMPRESSION: 1. Stable right chest tubes and postoperative changes to the right lung. 2. Asymmetric lucency in the right hemithorax may  be related to combination of lobectomy and known bullous disease, but a residual anterior pneumothorax is difficult to exclude. Repeat noncontrast Chest CT as needed. 3. No new cardiopulmonary abnormality. Electronically Signed   By: Odessa Fleming M.D.   On: 03/12/2018 08:34   Dg Chest Port 1 View  Result Date: 03/11/2018 CLINICAL DATA:  Follow-up chest tube EXAM: PORTABLE CHEST 1 VIEW COMPARISON:  03/10/2018 FINDINGS: Two right-sided chest tubes are again identified. Postsurgical changes on the right are noted. Left lung is well aerated. Cardiac shadow is within normal limits. No bony abnormality is seen. IMPRESSION: Stable chest tubes on the right with postoperative change. Significant improved aeration in the  right lung is noted. Electronically Signed   By: Alcide Clever M.D.   On: 03/11/2018 09:12   Dg Chest Port 1 View  Result Date: 03/10/2018 CLINICAL DATA:  Status post VATS on the right EXAM: PORTABLE CHEST 1 VIEW COMPARISON:  03/09/2018 FINDINGS: Pigtail catheter is been removed in the interval. Two new large bore chest tubes are noted in satisfactory position. Postsurgical changes are noted related to the recent right upper lobectomy. Absence of lung markings is noted superiorly related to the upper lobectomy and bulla excision. Left lung is clear. Cardiac shadow is stable. IMPRESSION: Postsurgical changes with right upper lobectomy. The remaining right middle and lower lobes are well aerated although do not fill the right hemithorax. Chest tubes are noted in place. Electronically Signed   By: Alcide Clever M.D.   On: 03/10/2018 12:07   Dg Chest Port 1 View  Result Date: 03/09/2018 CLINICAL DATA:  Pneumothorax, chest tube present EXAM: PORTABLE CHEST 1 VIEW COMPARISON:  03/07/2018 FINDINGS: Bullous emphysema with a large right apical bulla noted. The right-sided pleural drainage catheter remains in place. The previous right basilar pneumothorax along the margins of the pleural drainage catheter is no longer readily appreciable. There is some mild atelectasis or scarring at the right lung base. Cardiac and mediastinal margins appear normal. IMPRESSION: 1. The previous right basilar pneumothorax is no longer appreciable radiographically. The pleural drainage catheter remains in place. 2. Bullous emphysema. 3. Right lower lobe and right middle lobe airspace opacity persist. Electronically Signed   By: Gaylyn Rong M.D.   On: 03/09/2018 10:38   Dg Chest Portable 1 View  Result Date: 03/07/2018 CLINICAL DATA:  Right pneumothorax. EXAM: PORTABLE CHEST 1 VIEW 3:26 p.m. COMPARISON:  03/07/2018 at 1:14 p.m. FINDINGS: Chest tube has been inserted at the right lung base. Hydropneumothorax at the right base has  markedly diminished with increased inflation of the right lung. The patient has a large emphysematous bleb in the right upper lung zone. There are smaller blebs elsewhere in the right lung. Left lung is clear. Heart size and vascularity are normal. No acute bone abnormality. IMPRESSION: Minimal residual right basilar pneumothorax after chest tube insertion. Severe bleb formation in the right lung as described. Electronically Signed   By: Francene Boyers M.D.   On: 03/07/2018 15:42     Discharge Medications: Allergies as of 03/18/2018   No Known Allergies     Medication List    TAKE these medications   oxyCODONE 5 MG immediate release tablet Commonly known as:  Oxy IR/ROXICODONE Take 5 mg by mouth every 4-6 hours PRN severe pain.       Follow Up Appointments: Follow-up Information    Loreli Slot, MD. Go on 03/29/2018.   Specialty:  Cardiothoracic Surgery Why:  Appointment is at 11:45, please get CXR  at 11:15 at [facility] located on first floor of our office building Contact information: [address] [place] [address] [phone]        Assistance with locating PCP Follow up.   Contact information: Please call your insurance compay for assistance in locating a primary care doctor in network.  You can also contact [phone]          Signed: Lelon Huh [facility] 03/18/2018, 11:07 AM

## 2018-03-09 NOTE — Progress Notes (Addendum)
      [address]       [place] [id number]             [phone]         Procedure(s) (LRB): VIDEO ASSISTED THORACOSCOPY (Right) EXCISION OF BULLA (Right)  Subjective: Patient eating breakfast. He continues to have pain more so with deep inspiration  Objective: Vital signs in last 24 hours: Temp:  [97.8 F (36.6 C)-98.2 F (36.8 C)] 97.8 F (36.6 C) (09/18 0439) Pulse Rate:  [53-59] 59 (09/18 0439) Cardiac Rhythm: Sinus bradycardia (09/17 2104) Resp:  [21-22] 21 (09/18 0439) BP: (114-124)/(69-88) 124/88 (09/18 0439) SpO2:  [100 %] 100 % (09/18 0439)  Intake/Output from previous day: 09/17 0701 - 09/18 0700 In: 720 [P.O.:720] Out: 989 [Urine:875; Chest Tube:114]   Physical Exam:  Cardiovascular: Bradycardic Pulmonary: Clear on the left and somewhat coarse on the right Chest Tube: to water seal, no air leak  Lab Results: CBC: Recent Labs    03/07/18 1219  WBC 6.2  HGB 14.4  HCT 42.8  PLT 294   BMET:  Recent Labs    03/07/18 1219  NA 143  K 4.0  CL 107  CO2 25  GLUCOSE 86  BUN 11  CREATININE 1.06  CALCIUM 9.1    PT/INR: No results for input(s): LABPROT, INR in the last 72 hours. ABG:  INR: Will add last result for INR, ABG once components are confirmed Will add last 4 CBG results once components are confirmed  Assessment/Plan:  1. CV - SB. 2.  Pulmonary - Chest tube is to water seal. There is no air leak.  CXR appears stable (severe bleb right lung, residual right basilar pneumothorax). To OR in am for right VATS, removal of giant bullae.   Donielle M ZimmermanPA-C 03/09/2018,7:41 AM [phone]  No air leak at present but needs VATS due to giant bullae Will keep CT in place OR tomorrow. All questions answered. He is aware of risks and benefits  Viviann SpareSteven C. Dorris FetchHendrickson, MD [facility] [phone]

## 2018-03-10 ENCOUNTER — Inpatient Hospital Stay (HOSPITAL_COMMUNITY): Payer: BLUE CROSS/BLUE SHIELD | Admitting: Anesthesiology

## 2018-03-10 ENCOUNTER — Encounter (HOSPITAL_COMMUNITY)
Admission: EM | Disposition: A | Discharge status: Home / Self Care | Payer: Self-pay | Source: Home / Self Care | Attending: Thoracic Surgery (Cardiothoracic Vascular Surgery)

## 2018-03-10 ENCOUNTER — Encounter (HOSPITAL_COMMUNITY): Payer: Self-pay | Admitting: Anesthesiology

## 2018-03-10 ENCOUNTER — Inpatient Hospital Stay (HOSPITAL_COMMUNITY): Payer: BLUE CROSS/BLUE SHIELD

## 2018-03-10 DIAGNOSIS — J439 Emphysema, unspecified: Secondary | ICD-10-CM | POA: Diagnosis present

## 2018-03-10 HISTORY — PX: VIDEO ASSISTED THORACOSCOPY: SHX5073

## 2018-03-10 HISTORY — PX: EXCISION OF BULLA: SHX5095

## 2018-03-10 HISTORY — PX: LOBECTOMY: SHX5089

## 2018-03-10 LAB — CBC
HCT: 29.1 % — ABNORMAL LOW (ref 39.0–52.0)
Hemoglobin: 9.6 g/dL — ABNORMAL LOW (ref 13.0–17.0)
MCH: 30.9 pg (ref 26.0–34.0)
MCHC: 33 g/dL (ref 30.0–36.0)
MCV: 93.6 fL (ref 78.0–100.0)
Platelets: 227 10*3/uL (ref 150–400)
RBC: 3.11 MIL/uL — ABNORMAL LOW (ref 4.22–5.81)
RDW: 13.2 % (ref 11.5–15.5)
WBC: 12.3 10*3/uL — ABNORMAL HIGH (ref 4.0–10.5)

## 2018-03-10 LAB — PREPARE RBC (CROSSMATCH)

## 2018-03-10 LAB — POCT I-STAT 4, (NA,K, GLUC, HGB,HCT)
Glucose, Bld: 123 mg/dL — ABNORMAL HIGH (ref 70–99)
HEMATOCRIT: 29 % — AB (ref 39.0–52.0)
Hemoglobin: 9.9 g/dL — ABNORMAL LOW (ref 13.0–17.0)
Potassium: 4.1 mmol/L (ref 3.5–5.1)
Sodium: 137 mmol/L (ref 135–145)

## 2018-03-10 SURGERY — VIDEO ASSISTED THORACOSCOPY
Anesthesia: General | Site: Chest | Laterality: Right

## 2018-03-10 MED ORDER — FENTANYL CITRATE (PF) 250 MCG/5ML IJ SOLN
INTRAMUSCULAR | Status: AC
  Filled 2018-03-10: qty 5

## 2018-03-10 MED ORDER — SODIUM CHLORIDE 0.9% FLUSH: 9.0000 mL | INTRAVENOUS | Status: DC | PRN

## 2018-03-10 MED ORDER — PROPOFOL 10 MG/ML IV BOLUS
INTRAVENOUS | Status: AC
  Filled 2018-03-10: qty 20

## 2018-03-10 MED ORDER — MIDAZOLAM HCL 2 MG/2ML IJ SOLN
INTRAMUSCULAR | Status: AC
  Filled 2018-03-10: qty 2

## 2018-03-10 MED ORDER — CEFAZOLIN SODIUM-DEXTROSE 2-4 GM/100ML-% IV SOLN
2.0000 g | Freq: Three times a day (TID) | INTRAVENOUS | Status: AC
  Administered 2018-03-10 (×2): 2 g via INTRAVENOUS
  Filled 2018-03-10 (×2): qty 100

## 2018-03-10 MED ORDER — SODIUM CHLORIDE 0.9 % IV SOLN
INTRAVENOUS | Status: DC | PRN
  Administered 2018-03-10: 77 mL

## 2018-03-10 MED ORDER — ONDANSETRON HCL 4 MG/2ML IJ SOLN
INTRAMUSCULAR | Status: DC | PRN
  Administered 2018-03-10: 4 mg via INTRAVENOUS

## 2018-03-10 MED ORDER — MIDAZOLAM HCL 2 MG/2ML IJ SOLN: INTRAMUSCULAR | Status: DC | PRN

## 2018-03-10 MED ORDER — ACETAMINOPHEN 160 MG/5ML PO SOLN: 1000.0000 mg | Freq: Four times a day (QID) | ORAL | Status: AC

## 2018-03-10 MED ORDER — BISACODYL 5 MG PO TBEC
10.0000 mg | DELAYED_RELEASE_TABLET | Freq: Every day | ORAL | Status: DC
  Administered 2018-03-10 – 2018-03-18 (×6): 10 mg via ORAL
  Filled 2018-03-10 (×8): qty 2

## 2018-03-10 MED ORDER — ALBUMIN HUMAN 5 % IV SOLN
INTRAVENOUS | Status: AC
  Filled 2018-03-10: qty 250

## 2018-03-10 MED ORDER — SODIUM CHLORIDE 0.9 % IV SOLN: 10.0000 mL/h | Freq: Once | INTRAVENOUS | Status: DC

## 2018-03-10 MED ORDER — BUPIVACAINE HCL (PF) 0.5 % IJ SOLN
INTRAMUSCULAR | Status: AC
  Filled 2018-03-10: qty 30

## 2018-03-10 MED ORDER — FENTANYL CITRATE (PF) 100 MCG/2ML IJ SOLN: 25.0000 ug | INTRAMUSCULAR | Status: DC | PRN

## 2018-03-10 MED ORDER — ONDANSETRON HCL 4 MG/2ML IJ SOLN
4.0000 mg | Freq: Four times a day (QID) | INTRAMUSCULAR | Status: DC | PRN
  Administered 2018-03-12: 4 mg via INTRAVENOUS
  Filled 2018-03-10: qty 2

## 2018-03-10 MED ORDER — LIDOCAINE 2% (20 MG/ML) 5 ML SYRINGE
INTRAMUSCULAR | Status: DC | PRN
  Administered 2018-03-10: 80 mg via INTRAVENOUS

## 2018-03-10 MED ORDER — TRAMADOL HCL 50 MG PO TABS
50.0000 mg | ORAL_TABLET | Freq: Four times a day (QID) | ORAL | Status: DC | PRN
  Administered 2018-03-13 – 2018-03-14 (×4): 100 mg via ORAL
  Filled 2018-03-10 (×5): qty 2

## 2018-03-10 MED ORDER — SODIUM CHLORIDE 0.9 % IV SOLN
INTRAVENOUS | Status: DC
  Administered 2018-03-11 (×2): via INTRAVENOUS

## 2018-03-10 MED ORDER — IPRATROPIUM-ALBUTEROL 0.5-2.5 (3) MG/3ML IN SOLN
3.0000 mL | Freq: Three times a day (TID) | RESPIRATORY_TRACT | Status: DC
  Administered 2018-03-10 – 2018-03-11 (×2): 3 mL via RESPIRATORY_TRACT
  Filled 2018-03-10 (×2): qty 3

## 2018-03-10 MED ORDER — NALOXONE HCL 0.4 MG/ML IJ SOLN: 0.4000 mg | INTRAMUSCULAR | Status: DC | PRN

## 2018-03-10 MED ORDER — ALBUMIN HUMAN 5 % IV SOLN
INTRAVENOUS | Status: DC | PRN
  Administered 2018-03-10 (×3): via INTRAVENOUS

## 2018-03-10 MED ORDER — ALBUTEROL SULFATE (2.5 MG/3ML) 0.083% IN NEBU: 2.5000 mg | INHALATION_SOLUTION | RESPIRATORY_TRACT | Status: DC

## 2018-03-10 MED ORDER — ACETAMINOPHEN 500 MG PO TABS
1000.0000 mg | ORAL_TABLET | Freq: Four times a day (QID) | ORAL | Status: AC
  Administered 2018-03-10 – 2018-03-15 (×15): 1000 mg via ORAL
  Filled 2018-03-10 (×17): qty 2

## 2018-03-10 MED ORDER — ONDANSETRON HCL 4 MG/2ML IJ SOLN: 4.0000 mg | Freq: Four times a day (QID) | INTRAMUSCULAR | Status: DC | PRN

## 2018-03-10 MED ORDER — ROCURONIUM BROMIDE 50 MG/5ML IV SOSY
PREFILLED_SYRINGE | INTRAVENOUS | Status: AC
  Filled 2018-03-10: qty 10

## 2018-03-10 MED ORDER — OXYCODONE HCL 5 MG PO TABS: 5.0000 mg | ORAL_TABLET | Freq: Once | ORAL | Status: DC | PRN

## 2018-03-10 MED ORDER — ENOXAPARIN SODIUM 40 MG/0.4ML ~~LOC~~ SOLN
40.0000 mg | Freq: Every day | SUBCUTANEOUS | Status: DC
  Administered 2018-03-11 – 2018-03-18 (×8): 40 mg via SUBCUTANEOUS
  Filled 2018-03-10 (×8): qty 0.4

## 2018-03-10 MED ORDER — DEXAMETHASONE SODIUM PHOSPHATE 10 MG/ML IJ SOLN
INTRAMUSCULAR | Status: DC | PRN
  Administered 2018-03-10: 10 mg via INTRAVENOUS

## 2018-03-10 MED ORDER — GLYCOPYRROLATE PF 0.2 MG/ML IJ SOSY
PREFILLED_SYRINGE | INTRAMUSCULAR | Status: DC | PRN
  Administered 2018-03-10 (×2): .1 mg via INTRAVENOUS

## 2018-03-10 MED ORDER — DIPHENHYDRAMINE HCL 12.5 MG/5ML PO ELIX
12.5000 mg | ORAL_SOLUTION | Freq: Four times a day (QID) | ORAL | Status: DC | PRN
  Filled 2018-03-10: qty 5

## 2018-03-10 MED ORDER — ESMOLOL HCL 100 MG/10ML IV SOLN
INTRAVENOUS | Status: AC
  Filled 2018-03-10: qty 10

## 2018-03-10 MED ORDER — OXYCODONE HCL 5 MG PO TABS
5.0000 mg | ORAL_TABLET | ORAL | Status: DC | PRN
  Administered 2018-03-11 – 2018-03-13 (×7): 5 mg via ORAL
  Administered 2018-03-13: 10 mg via ORAL
  Administered 2018-03-14 – 2018-03-15 (×5): 5 mg via ORAL
  Administered 2018-03-16 (×2): 10 mg via ORAL
  Filled 2018-03-10 (×2): qty 1
  Filled 2018-03-10: qty 2
  Filled 2018-03-10 (×8): qty 1
  Filled 2018-03-10: qty 2
  Filled 2018-03-10 (×2): qty 1
  Filled 2018-03-10: qty 2
  Filled 2018-03-10: qty 1
  Filled 2018-03-10: qty 2

## 2018-03-10 MED ORDER — 0.9 % SODIUM CHLORIDE (POUR BTL) OPTIME
TOPICAL | Status: DC | PRN
  Administered 2018-03-10 (×2): 1000 mL

## 2018-03-10 MED ORDER — LIDOCAINE 2% (20 MG/ML) 5 ML SYRINGE
INTRAMUSCULAR | Status: AC
  Filled 2018-03-10: qty 5

## 2018-03-10 MED ORDER — OXYCODONE HCL 5 MG/5ML PO SOLN: 5.0000 mg | Freq: Once | ORAL | Status: DC | PRN

## 2018-03-10 MED ORDER — POTASSIUM CHLORIDE 10 MEQ/50ML IV SOLN
10.0000 meq | Freq: Every day | INTRAVENOUS | Status: DC | PRN
  Filled 2018-03-10: qty 50

## 2018-03-10 MED ORDER — FENTANYL CITRATE (PF) 250 MCG/5ML IJ SOLN
INTRAMUSCULAR | Status: DC | PRN
  Administered 2018-03-10: 100 ug via INTRAVENOUS
  Administered 2018-03-10 (×3): 50 ug via INTRAVENOUS

## 2018-03-10 MED ORDER — ALBUTEROL SULFATE (2.5 MG/3ML) 0.083% IN NEBU
2.5000 mg | INHALATION_SOLUTION | RESPIRATORY_TRACT | Status: DC
  Administered 2018-03-10: 2.5 mg via RESPIRATORY_TRACT
  Filled 2018-03-10: qty 3

## 2018-03-10 MED ORDER — KETOROLAC TROMETHAMINE 30 MG/ML IJ SOLN
30.0000 mg | Freq: Three times a day (TID) | INTRAMUSCULAR | Status: AC
  Administered 2018-03-10 – 2018-03-12 (×4): 30 mg via INTRAVENOUS
  Filled 2018-03-10 (×4): qty 1

## 2018-03-10 MED ORDER — PHENYLEPHRINE 40 MCG/ML (10ML) SYRINGE FOR IV PUSH (FOR BLOOD PRESSURE SUPPORT)
PREFILLED_SYRINGE | INTRAVENOUS | Status: DC | PRN
  Administered 2018-03-10 (×4): 80 ug via INTRAVENOUS
  Administered 2018-03-10: 40 ug via INTRAVENOUS
  Administered 2018-03-10: 80 ug via INTRAVENOUS
  Administered 2018-03-10: 40 ug via INTRAVENOUS
  Administered 2018-03-10 (×2): 80 ug via INTRAVENOUS
  Administered 2018-03-10 (×2): 40 ug via INTRAVENOUS
  Administered 2018-03-10: 80 ug via INTRAVENOUS

## 2018-03-10 MED ORDER — ROCURONIUM BROMIDE 10 MG/ML (PF) SYRINGE
PREFILLED_SYRINGE | INTRAVENOUS | Status: DC | PRN
  Administered 2018-03-10 (×3): 10 mg via INTRAVENOUS
  Administered 2018-03-10: 20 mg via INTRAVENOUS
  Administered 2018-03-10: 50 mg via INTRAVENOUS

## 2018-03-10 MED ORDER — FENTANYL 40 MCG/ML IV SOLN
INTRAVENOUS | Status: DC
  Administered 2018-03-10: 1.5 mL via INTRAVENOUS
  Administered 2018-03-10: 40 ug via INTRAVENOUS
  Administered 2018-03-10: 135 ug via INTRAVENOUS
  Administered 2018-03-10: 60 ug via INTRAVENOUS
  Administered 2018-03-11 (×2): 90 ug via INTRAVENOUS
  Administered 2018-03-11: 30 ug via INTRAVENOUS
  Administered 2018-03-11: 68.03 ug via INTRAVENOUS
  Administered 2018-03-11: 60 ug via INTRAVENOUS
  Administered 2018-03-12: 75 ug via INTRAVENOUS
  Administered 2018-03-12: 105 ug via INTRAVENOUS
  Administered 2018-03-12: 1000 ug via INTRAVENOUS
  Administered 2018-03-12: 180 ug via INTRAVENOUS
  Administered 2018-03-12: 90 ug via INTRAVENOUS
  Administered 2018-03-13: 135 ug via INTRAVENOUS
  Administered 2018-03-13: 60 ug via INTRAVENOUS
  Administered 2018-03-13: 45 ug via INTRAVENOUS
  Administered 2018-03-14: 1000 ug via INTRAVENOUS
  Administered 2018-03-14: 60 ug via INTRAVENOUS
  Administered 2018-03-14: 70 ug via INTRAVENOUS
  Administered 2018-03-14: 30 ug via INTRAVENOUS
  Filled 2018-03-10 (×4): qty 25

## 2018-03-10 MED ORDER — MIDAZOLAM HCL 5 MG/5ML IJ SOLN
INTRAMUSCULAR | Status: DC | PRN
  Administered 2018-03-10: 2 mg via INTRAVENOUS

## 2018-03-10 MED ORDER — BUPIVACAINE LIPOSOME 1.3 % IJ SUSP
20.0000 mL | INTRAMUSCULAR | Status: DC
  Filled 2018-03-10: qty 20

## 2018-03-10 MED ORDER — ESMOLOL HCL 100 MG/10ML IV SOLN
INTRAVENOUS | Status: DC | PRN
  Administered 2018-03-10: 20 mg via INTRAVENOUS

## 2018-03-10 MED ORDER — PANTOPRAZOLE SODIUM 40 MG PO TBEC
40.0000 mg | DELAYED_RELEASE_TABLET | Freq: Every day | ORAL | Status: DC
  Administered 2018-03-10 – 2018-03-18 (×9): 40 mg via ORAL
  Filled 2018-03-10 (×9): qty 1

## 2018-03-10 MED ORDER — ALBUMIN HUMAN 5 % IV SOLN
12.5000 g | Freq: Once | INTRAVENOUS | Status: AC
  Administered 2018-03-10: 12.5 g via INTRAVENOUS

## 2018-03-10 MED ORDER — ALBUTEROL SULFATE (2.5 MG/3ML) 0.083% IN NEBU: 2.5000 mg | INHALATION_SOLUTION | RESPIRATORY_TRACT | Status: DC | PRN

## 2018-03-10 MED ORDER — PROPOFOL 10 MG/ML IV BOLUS
INTRAVENOUS | Status: DC | PRN
  Administered 2018-03-10: 200 mg via INTRAVENOUS

## 2018-03-10 MED ORDER — FENTANYL CITRATE (PF) 100 MCG/2ML IJ SOLN: INTRAMUSCULAR | Status: DC | PRN

## 2018-03-10 MED ORDER — SUGAMMADEX SODIUM 200 MG/2ML IV SOLN
INTRAVENOUS | Status: DC | PRN
  Administered 2018-03-10: 128.2 mg via INTRAVENOUS

## 2018-03-10 MED ORDER — DIPHENHYDRAMINE HCL 50 MG/ML IJ SOLN: 12.5000 mg | Freq: Four times a day (QID) | INTRAMUSCULAR | Status: DC | PRN

## 2018-03-10 MED ORDER — SODIUM CHLORIDE 0.9 % IV SOLN
INTRAVENOUS | Status: DC | PRN
  Administered 2018-03-10 (×2): 25 ug/min via INTRAVENOUS

## 2018-03-10 MED ORDER — SENNOSIDES-DOCUSATE SODIUM 8.6-50 MG PO TABS
1.0000 | ORAL_TABLET | Freq: Every day | ORAL | Status: DC
  Administered 2018-03-10 – 2018-03-17 (×4): 1 via ORAL
  Filled 2018-03-10 (×5): qty 1

## 2018-03-10 MED ORDER — HEMOSTATIC AGENTS (NO CHARGE) OPTIME
TOPICAL | Status: DC | PRN
  Administered 2018-03-10: 2 via TOPICAL

## 2018-03-10 MED ORDER — FENTANYL CITRATE (PF) 100 MCG/2ML IJ SOLN
INTRAMUSCULAR | Status: AC
  Filled 2018-03-10: qty 2

## 2018-03-10 MED ORDER — PHENYLEPHRINE 40 MCG/ML (10ML) SYRINGE FOR IV PUSH (FOR BLOOD PRESSURE SUPPORT)
PREFILLED_SYRINGE | INTRAVENOUS | Status: AC
  Filled 2018-03-10: qty 10

## 2018-03-10 MED ORDER — LACTATED RINGERS IV SOLN
INTRAVENOUS | Status: DC | PRN
  Administered 2018-03-10 (×3): via INTRAVENOUS

## 2018-03-10 SURGICAL SUPPLY — 79 items
APPLICATOR COTTON TIP 6 STRL (MISCELLANEOUS) ×1 IMPLANT
APPLICATOR COTTON TIP 6IN STRL (MISCELLANEOUS) ×3
APPLIER CLIP 5 13 M/L LIGAMAX5 (MISCELLANEOUS) ×3
APPLIER CLIP ROT 10 11.4 M/L (STAPLE) ×3
CANISTER SUCT 3000ML PPV (MISCELLANEOUS) ×3 IMPLANT
CATH THORACIC 28FR (CATHETERS) ×3 IMPLANT
CATH THORACIC 28FR RT ANG (CATHETERS) ×3 IMPLANT
CATH THORACIC 36FR (CATHETERS) IMPLANT
CATH THORACIC 36FR RT ANG (CATHETERS) IMPLANT
CLIP APPLIE 5 13 M/L LIGAMAX5 (MISCELLANEOUS) ×1 IMPLANT
CLIP APPLIE ROT 10 11.4 M/L (STAPLE) ×1 IMPLANT
CLIP VESOCCLUDE MED 6/CT (CLIP) ×3 IMPLANT
CONN Y 3/8X3/8X3/8  BEN (MISCELLANEOUS) ×2
CONN Y 3/8X3/8X3/8 BEN (MISCELLANEOUS) ×1 IMPLANT
CONT SPEC 4OZ CLIKSEAL STRL BL (MISCELLANEOUS) ×30 IMPLANT
COVER SURGICAL LIGHT HANDLE (MISCELLANEOUS) ×3 IMPLANT
DERMABOND ADVANCED (GAUZE/BANDAGES/DRESSINGS)
DERMABOND ADVANCED .7 DNX12 (GAUZE/BANDAGES/DRESSINGS) IMPLANT
DRAIN CHANNEL 28F RND 3/8 FF (WOUND CARE) IMPLANT
DRAIN CHANNEL 32F RND 10.7 FF (WOUND CARE) IMPLANT
DRAPE LAPAROSCOPIC ABDOMINAL (DRAPES) ×3 IMPLANT
DRAPE SLUSH/WARMER DISC (DRAPES) ×3 IMPLANT
ELECT REM PT RETURN 9FT ADLT (ELECTROSURGICAL) ×3
ELECTRODE REM PT RTRN 9FT ADLT (ELECTROSURGICAL) ×1 IMPLANT
GAUZE SPONGE 4X4 12PLY STRL (GAUZE/BANDAGES/DRESSINGS) ×3 IMPLANT
GLOVE SURG SIGNA 7.5 PF LTX (GLOVE) ×6 IMPLANT
GOWN STRL REUS W/ TWL LRG LVL3 (GOWN DISPOSABLE) ×2 IMPLANT
GOWN STRL REUS W/ TWL XL LVL3 (GOWN DISPOSABLE) ×1 IMPLANT
GOWN STRL REUS W/TWL LRG LVL3 (GOWN DISPOSABLE) ×4
GOWN STRL REUS W/TWL XL LVL3 (GOWN DISPOSABLE) ×2
HEMOSTAT SURGICEL 2X14 (HEMOSTASIS) ×6 IMPLANT
IV CATH 22GX1 FEP (IV SOLUTION) IMPLANT
KIT BASIN OR (CUSTOM PROCEDURE TRAY) ×3 IMPLANT
KIT SUCTION CATH 14FR (SUCTIONS) ×3 IMPLANT
KIT TURNOVER KIT B (KITS) ×3 IMPLANT
NEEDLE HYPO 25GX1X1/2 BEV (NEEDLE) ×3 IMPLANT
NEEDLE SPNL 18GX3.5 QUINCKE PK (NEEDLE) ×3 IMPLANT
NS IRRIG 1000ML POUR BTL (IV SOLUTION) ×6 IMPLANT
PACK CHEST (CUSTOM PROCEDURE TRAY) ×3 IMPLANT
PAD ARMBOARD 7.5X6 YLW CONV (MISCELLANEOUS) ×6 IMPLANT
POUCH ENDO CATCH II 15MM (MISCELLANEOUS) IMPLANT
POUCH SPECIMEN RETRIEVAL 10MM (ENDOMECHANICALS) IMPLANT
RELOAD STAPLER GOLD 60MM (STAPLE) ×20 IMPLANT
RELOAD STAPLER GREEN 60MM (STAPLE) ×1 IMPLANT
SEALANT PROGEL (MISCELLANEOUS) IMPLANT
SEALANT SURG COSEAL 4ML (VASCULAR PRODUCTS) IMPLANT
SEALANT SURG COSEAL 8ML (VASCULAR PRODUCTS) IMPLANT
SOLUTION ANTI FOG 6CC (MISCELLANEOUS) ×3 IMPLANT
SPECIMEN JAR MEDIUM (MISCELLANEOUS) ×6 IMPLANT
SPONGE INTESTINAL PEANUT (DISPOSABLE) ×6 IMPLANT
SPONGE TONSIL TAPE 1 RFD (DISPOSABLE) ×3 IMPLANT
STAPLE ECHEON FLEX 60 POW ENDO (STAPLE) ×3 IMPLANT
STAPLE RELOAD 2.5MM WHITE (STAPLE) ×18 IMPLANT
STAPLER RELOAD GOLD 60MM (STAPLE) ×60
STAPLER RELOAD GREEN 60MM (STAPLE) ×3
STAPLER VASCULAR ECHELON 35 (CUTTER) ×3 IMPLANT
SUT PROLENE 4 0 RB 1 (SUTURE) ×2
SUT PROLENE 4-0 RB1 .5 CRCL 36 (SUTURE) ×1 IMPLANT
SUT SILK  1 MH (SUTURE) ×4
SUT SILK 1 MH (SUTURE) ×2 IMPLANT
SUT SILK 2 0SH CR/8 30 (SUTURE) IMPLANT
SUT SILK 3 0SH CR/8 30 (SUTURE) IMPLANT
SUT VIC AB 1 CTX 36 (SUTURE) ×2
SUT VIC AB 1 CTX36XBRD ANBCTR (SUTURE) ×1 IMPLANT
SUT VIC AB 2-0 CTX 36 (SUTURE) ×3 IMPLANT
SUT VIC AB 2-0 UR6 27 (SUTURE) IMPLANT
SUT VIC AB 3-0 MH 27 (SUTURE) IMPLANT
SUT VIC AB 3-0 X1 27 (SUTURE) ×3 IMPLANT
SUT VICRYL 2 TP 1 (SUTURE) IMPLANT
SYR 20CC LL (SYRINGE) ×3 IMPLANT
SYSTEM SAHARA CHEST DRAIN ATS (WOUND CARE) ×3 IMPLANT
TAPE CLOTH 4X10 WHT NS (GAUZE/BANDAGES/DRESSINGS) ×3 IMPLANT
TIP APPLICATOR SPRAY EXTEND 16 (VASCULAR PRODUCTS) IMPLANT
TOWEL GREEN STERILE (TOWEL DISPOSABLE) ×3 IMPLANT
TOWEL GREEN STERILE FF (TOWEL DISPOSABLE) ×3 IMPLANT
TRAY FOLEY MTR SLVR 16FR STAT (SET/KITS/TRAYS/PACK) ×3 IMPLANT
TROCAR XCEL BLADELESS 5X75MML (TROCAR) ×3 IMPLANT
TROCAR XCEL NON-BLD 5MMX100MML (ENDOMECHANICALS) IMPLANT
WATER STERILE IRR 1000ML POUR (IV SOLUTION) ×6 IMPLANT

## 2018-03-10 NOTE — Anesthesia Preprocedure Evaluation (Signed)
Anesthesia Evaluation  Patient identified by MRN, date of birth, ID band Patient awake    Reviewed: Allergy & Precautions, H&P , NPO status , Patient's Chart, lab work & pertinent test results  Airway Mallampati: II   Neck ROM: full    Dental   Pulmonary Current Smoker,  Right PTX. Bullae   breath sounds clear to auscultation       Cardiovascular negative cardio ROS   Rhythm:regular Rate:Normal     Neuro/Psych    GI/Hepatic   Endo/Other    Renal/GU      Musculoskeletal   Abdominal   Peds  Hematology   Anesthesia Other Findings   Reproductive/Obstetrics                             Anesthesia Physical Anesthesia Plan  ASA: II  Anesthesia Plan: General   Post-op Pain Management:    Induction: Intravenous  PONV Risk Score and Plan: 1 and Ondansetron, Dexamethasone, Midazolam and Treatment may vary due to age or medical condition  Airway Management Planned: Oral ETT  Additional Equipment: Arterial line  Intra-op Plan:   Post-operative Plan: Extubation in OR  Informed Consent: I have reviewed the patients History and Physical, chart, labs and discussed the procedure including the risks, benefits and alternatives for the proposed anesthesia with the patient or authorized representative who has indicated his/her understanding and acceptance.     Plan Discussed with: CRNA, Anesthesiologist and Surgeon  Anesthesia Plan Comments:         Anesthesia Quick Evaluation

## 2018-03-10 NOTE — Op Note (Signed)
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  PROCEDURE:  Right video-assisted thoracoscopy, right upper lobectomy, intercostal nerve block.  SURGEON:  Charlett Lango, MD  ASSISTANT:  Gershon Crane, PA  ANESTHESIA:  General.  FINDINGS:  Mass of blebs essentially replacing the entire right upper lobe.  Minimal functional lung with significant air leak after bullectomy.  Decision made to proceed with lobectomy, small bleb in superior segment of right lower lobe.   Bleeding from internal mammary artery during takedown of adhesions.  CLINICAL NOTE:  The patient is a 42 year old gentleman who presented to the emergency room with a complaint of right-sided chest pain and shortness of breath.  He was found to have a right spontaneous pneumothorax.  A pigtail catheter was placed with  resolution of the pneumothorax.  A CT of the chest showed massive bullae primarily involving the upper lobe.  The patient was advised to undergo surgical resection of the bullae due to the high risk of recurrent pneumothorax.  The indications, risks,  benefits, and alternatives were discussed in detail with the patient.  He understood and accepted the risks and agreed to proceed.  OPERATIVE NOTE:  The patient was brought to the preoperative holding area on 03/10/2018.  Anesthesia placed a central venous line and an arterial blood pressure monitor line.  He was taken to the operating room, anesthetized and intubated with a double  lumen endotracheal tube.  Intravenous antibiotics were administered.  A Foley catheter was placed.  Sequential compression devices were placed on the calves for DVT  prophylaxis.  He was placed in a left lateral decubitus position and the right chest was  prepped and draped in the usual sterile fashion.  The patient was placed on single lung ventilation of the left lung and the pigtail catheter was removed prior to prepping the chest.  The patient tolerated single lung ventilation well throughout the  procedure.  A timeout was performed.  A solution of 20 mL of liposomal bupivacaine, 30 mL of 0.5% bupivacaine and 50 mL of saline was premixed.  It was used for local anesthesia at the incisions as well as for the nerve blocks.  Bupivacaine solution was injected at  the seventh interspace in the midaxillary line.  An incision then was made and a 5 mm port was inserted into the chest.  The thoracoscope was advanced into the chest.  There was good isolation of the right lung.  A mass of blebs from the upper lobe were  immediately visible.  The area for the incision in the fourth interspace anterolaterally was anesthetized with the bupivacaine solution and then a 5 cm working incision was made.  No rib spreading was performed during the procedure.  The upper lobe was  inspected.  There was a very minimal amount of parenchyma uninvolved with bullae.  The majority of the upper lobe was involved with massive bullae.  There were adhesions to the chest wall.  These were taken down with electrocautery.  While taking the  adhesions down superiorly and medially, bleeding was encountered.  The source was unclear initially. This appeared to be bleeding from the lung parenchyma.  A sponge stick was used to hold pressure on the area.  The largest bulla was grasped.  It was felt  that if  this was removed, it would improve visualization.  A bullectomy was performed with multiple firings of an Echelon 60 mm stapler using gold cartridges.  Next, a plane was developed medially between the bulk of the upper lobe and the area where the  bleeding was coming from and this area was stapled off as  well.  Further inspection showed that the bleeding was coming from the internal mammary artery and vein.  Clips were applied on the mammary artery with good hemostasis.  The venous bleeding was  not apparent intially.  After the clips were placed, additional blebs and bullae were noted throughout the right upper lobe and these were removed with sequential fires of the stapler.  There was a small bleb on the superior segment of the right lower lobe.  This  was likewise removed with a stapler.  All these were sent to pathology as a separate specimens.  At this point, it was noted there was ongoing venous bleeding and clips were applied to the internal mammary vein with resolution of that bleeding.  The  chest was filled with warm saline.  A test inflation to 20 cm of water revealed significant air leakage around some of the staple lines as well as from the parenchyma of the right upper lobe.  It was felt with additional stapling there would be essentially no functional right upper lobe left.  The decision was made to proceed with lobectomy.  Prior to doing the lobectomy, intercostal nerve blocks were performed from the third to the ninth interspace.  5 mL of the bupivacaine solution was  injected into each interspace in a subpleural location from a posterior approach.  The minor fissure then was completed with sequential firings of the Echelon stapler.  The superior pulmonary vein branches were easily identified.  These were dissected  out and divided individually with the vascular stapler.  Dissection was carried superiorly, there was a large level 10 node which was removed to expose the pulmonary arterial branches.  The anterior and apical branches arose as a single trunk.  This was  divided with the vascular stapler.  The posterior ascending branch was also divided with the vascular stapler.  There was minimal remaining parenchyma in the major fissure between the superior segment and the upper lobe.  This  was divided using the  Echelon stapler.  The stapler then was placed across the right upper lobe bronchus at its origin and closed.  A test inflation showed good aeration of the lower and middle lobes.  The stapler was fired, transecting the bronchus.  The right upper lobe was  removed and was sent for permanent pathology.  The chest was copiously irrigated with warm saline.  A test inflation showed no leaks from the bronchial stump.  The middle lobe was tacked to the lower lobe with the Echelon stapler to prevent torsion.   There was no evidence of ongoing bleeding.  A 28-French Blake drain was placed through the original port incision.  An area for a second port incision then was anesthetized with the bupivacaine solution and a 28 French chest tube was placed direct  anteriorly into the apex.  Chest tubes were secured with #1 silk sutures. The middle lobe was stapled to the lower lobe to prevent torsion. The lower and middle lobes were reinflated.  The working incision was closed in 3 layers.  All sponge, needle and instrument counts were correct at the end of the procedure.  The  patient  was placed back in supine position.  The chest tubes were placed to suction.  He was extubated in the operating room and taken to the Postanesthetic Care Unit in good condition.  TN/NUANCE  D:03/10/2018 T:03/10/2018 JOB:[id number]

## 2018-03-10 NOTE — Anesthesia Procedure Notes (Signed)
Procedure Name: Intubation Date/Time: 03/10/2018 8:20 AM Performed by: Adria Dillonkin, Lejend Dalby A, CRNA Pre-anesthesia Checklist: Patient identified, Emergency Drugs available, Suction available and Patient being monitored Patient Re-evaluated:Patient Re-evaluated prior to induction Oxygen Delivery Method: Circle system utilized Preoxygenation: Pre-oxygenation with 100% oxygen Induction Type: IV induction Ventilation: Mask ventilation without difficulty Laryngoscope Size: Miller and 3 Grade View: Grade I Endobronchial tube: Left, Double lumen EBT, EBT position confirmed by auscultation and EBT position confirmed by fiberoptic bronchoscope and 39 Fr Number of attempts: 2 Airway Equipment and Method: Rigid stylet Placement Confirmation: ETT inserted through vocal cords under direct vision,  positive ETCO2,  CO2 detector and breath sounds checked- equal and bilateral Tube secured with: Tape Dental Injury: Teeth and Oropharynx as per pre-operative assessment

## 2018-03-10 NOTE — Anesthesia Procedure Notes (Signed)
Arterial Line Insertion Start/End9/19/2019 7:30 AM, 03/10/2018 7:43 AM Performed by: Adair LaundryPaxton, Khylon Davies A, CRNA, CRNA  Patient location: Pre-op. Preanesthetic checklist: patient identified, IV checked, risks and benefits discussed, surgical consent and monitors and equipment checked Lidocaine 1% used for infiltration Right, radial was placed Catheter size: 20 G Hand hygiene performed , maximum sterile barriers used  and Seldinger technique used  Attempts: 1 Procedure performed without using ultrasound guided technique. Following insertion, dressing applied and Biopatch. Post procedure assessment: normal  Patient tolerated the procedure well with no immediate complications.

## 2018-03-10 NOTE — Transfer of Care (Signed)
Immediate Anesthesia Transfer of Care Note  Patient: Troy Farley  Procedure(s) Performed: VIDEO ASSISTED THORACOSCOPY (Right Chest) EXCISION OF BULLA (Right Chest) Right Upper LOBECTOMY (Right Chest)  Patient Location: PACU  Anesthesia Type:General  Level of Consciousness: awake, alert , drowsy and patient cooperative  Airway & Oxygen Therapy: Patient Spontanous Breathing and Patient connected to face mask oxygen  Post-op Assessment: Report given to RN and Post -op Vital signs reviewed and stable  Post vital signs: Reviewed and stable  Last Vitals:  Vitals Value Taken Time  BP 76/47 03/10/2018 11:47 AM  Temp    Pulse    Resp 22 03/10/2018 11:53 AM  SpO2    Vitals shown include unvalidated device data.  Last Pain:  Vitals:   03/10/18 0431  TempSrc: Oral  PainSc:       Patients Stated Pain Goal: 4 (03/09/18 2154)  Complications: No apparent anesthesia complications

## 2018-03-10 NOTE — Anesthesia Postprocedure Evaluation (Signed)
Anesthesia Post Note  Patient: Troy Farley  Procedure(s) Performed: VIDEO ASSISTED THORACOSCOPY (Right Chest) EXCISION OF BULLA (Right Chest) Right Upper LOBECTOMY (Right Chest)     Patient location during evaluation: PACU Anesthesia Type: General Level of consciousness: awake and alert Pain management: pain level controlled Vital Signs Assessment: post-procedure vital signs reviewed and stable Respiratory status: spontaneous breathing, nonlabored ventilation, respiratory function stable and patient connected to nasal cannula oxygen Cardiovascular status: blood pressure returned to baseline and stable Postop Assessment: no apparent nausea or vomiting Anesthetic complications: no    Last Vitals:  Vitals:   03/10/18 1347 03/10/18 1402  BP: 110/73 107/73  Pulse:    Resp: 19 19  Temp: 36.8 C   SpO2: 100%     Last Pain:  Vitals:   03/10/18 1347  TempSrc:   PainSc: Asleep                 Xavien Dauphinais S

## 2018-03-10 NOTE — Brief Op Note (Addendum)
03/07/2018 - 03/10/2018  11:28 AM  PATIENT:  Troy Farley  42 y.o. male  PRE-OPERATIVE DIAGNOSIS:  RIGHT PTX BULLAE  POST-OPERATIVE DIAGNOSIS:  RIGHT PTX BULLAE  PROCEDURE:  Procedure(s): VIDEO ASSISTED THORACOSCOPY (Right) EXCISION OF BULLA (Right) Right Upper LOBECTOMY (Right)  SURGEON:  Surgeon(s) and Role:    * Loreli SlotHendrickson, Dana Debo C, MD - Primary  PHYSICIAN ASSISTANT: WAYNE GOLD PA-C  ANESTHESIA:   general  EBL:  2200 mL   BLOOD ADMINISTERED:none  DRAINS: 2 Chest Tube(s) in the RIGHT HEMITHORAX   LOCAL MEDICATIONS USED:  BUPIVICAINE   SPECIMEN:  Source of Specimen:  COMPLETION RUL, MULTIPLE WEDGE RESECTION  DISPOSITION OF SPECIMEN:  PATHOLOGY  COUNTS:  YES  TOURNIQUET:  * No tourniquets in log *  DICTATION: .Other Dictation: Dictation Number [id number]  PLAN OF CARE: Admit to inpatient   PATIENT DISPOSITION:  PACU - hemodynamically stable.   Delay start of Pharmacological VTE agent (>24hrs) due to surgical blood loss or risk of bleeding: yes

## 2018-03-10 NOTE — Interval H&P Note (Signed)
History and Physical Interval Note:  03/10/2018 7:57 AM  Rainier Koller  has presented today for surgery, with the diagnosis of RIGHT PTX BULLAE  The various methods of treatment have been discussed with the patient and family. After consideration of risks, benefits and other options for treatment, the patient has consented to  Procedure(s): VIDEO ASSISTED THORACOSCOPY (Right) EXCISION OF BULLA (Right) as a surgical intervention .  The patient's history has been reviewed, patient examined, no change in status, stable for surgery.  I have reviewed the patient's chart and labs.  Questions were answered to the patient's satisfaction.     Loreli SlotSteven C Crescencio Jozwiak

## 2018-03-11 ENCOUNTER — Encounter (HOSPITAL_COMMUNITY): Payer: Self-pay | Admitting: Thoracic Surgery (Cardiothoracic Vascular Surgery)

## 2018-03-11 ENCOUNTER — Inpatient Hospital Stay (HOSPITAL_COMMUNITY): Payer: BLUE CROSS/BLUE SHIELD

## 2018-03-11 DIAGNOSIS — Z736 Limitation of activities due to disability: Secondary | ICD-10-CM

## 2018-03-11 LAB — BASIC METABOLIC PANEL
ANION GAP: 8 (ref 5–15)
BUN: 10 mg/dL (ref 6–20)
CALCIUM: 8.2 mg/dL — AB (ref 8.9–10.3)
CO2: 27 mmol/L (ref 22–32)
Chloride: 103 mmol/L (ref 98–111)
Creatinine, Ser: 0.97 mg/dL (ref 0.61–1.24)
GFR calc Af Amer: >60 mL/min (ref >60)
GFR calc non Af Amer: >60 mL/min (ref >60)
Glucose, Bld: 124 mg/dL — ABNORMAL HIGH (ref 70–99)
POTASSIUM: 3.6 mmol/L (ref 3.5–5.1)
Sodium: 138 mmol/L (ref 135–145)

## 2018-03-11 LAB — CBC
HCT: 25.2 % — ABNORMAL LOW (ref 39.0–52.0)
Hemoglobin: 8.4 g/dL — ABNORMAL LOW (ref 13.0–17.0)
MCH: 30.9 pg (ref 26.0–34.0)
MCHC: 33.3 g/dL (ref 30.0–36.0)
MCV: 92.6 fL (ref 78.0–100.0)
Platelets: 221 10*3/uL (ref 150–400)
RBC: 2.72 MIL/uL — ABNORMAL LOW (ref 4.22–5.81)
RDW: 13.1 % (ref 11.5–15.5)
WBC: 10.2 10*3/uL (ref 4.0–10.5)

## 2018-03-11 LAB — BLOOD GAS, ARTERIAL
Acid-Base Excess: 3.6 mmol/L — ABNORMAL HIGH (ref 0.0–2.0)
Bicarbonate: 27.7 mmol/L (ref 20.0–28.0)
FIO2: 28
O2 SAT: 98.3 %
PATIENT TEMPERATURE: 98.6
pCO2 arterial: 42.9 mmHg (ref 32.0–48.0)
pH, Arterial: 7.426 (ref 7.350–7.450)
pO2, Arterial: 115 mmHg — ABNORMAL HIGH (ref 83.0–108.0)

## 2018-03-11 MED ORDER — IPRATROPIUM-ALBUTEROL 0.5-2.5 (3) MG/3ML IN SOLN
3.0000 mL | Freq: Two times a day (BID) | RESPIRATORY_TRACT | Status: DC
  Administered 2018-03-11 – 2018-03-12 (×2): 3 mL via RESPIRATORY_TRACT
  Filled 2018-03-11 (×2): qty 3

## 2018-03-11 NOTE — Progress Notes (Signed)
1 Day Post-Op Procedure(s) (LRB): VIDEO ASSISTED THORACOSCOPY (Right) EXCISION OF BULLA (Right) Right Upper LOBECTOMY (Right) Subjective: Some incisional pain  Objective: Vital signs in last 24 hours: Temp:  [97.2 F (36.2 C)-99.8 F (37.7 C)] 98.6 F (37 C) (09/20 0751) Pulse Rate:  [84-103] 86 (09/20 0751) Cardiac Rhythm: Normal sinus rhythm (09/20 0700) Resp:  [13-22] 17 (09/20 0751) BP: (80-123)/(48-79) 123/66 (09/20 0751) SpO2:  [97 %-100 %] 100 % (09/20 0751) Arterial Line BP: (104-152)/(51-70) 149/70 (09/20 0155)  Hemodynamic parameters for last 24 hours:    Intake/Output from previous day: 09/19 0701 - 09/20 0700 In: 4822.9 [P.O.:240; I.V.:3732.9; IV Piggyback:850] Out: 4900 [Urine:2300; Blood:2200; Chest Tube:400] Intake/Output this shift: Total I/O In: 0  Out: 300 [Urine:300]  General appearance: alert, cooperative and mild distress Neurologic: intact Heart: regular rate and rhythm Lungs: clear to auscultation bilaterally Abdomen: normal findings: soft, non-tender + air leak  Lab Results: Recent Labs    03/10/18 1200 03/11/18 0406  WBC 12.3* 10.2  HGB 9.6* 8.4*  HCT 29.1* 25.2*  PLT 227 221   BMET:  Recent Labs    03/09/18 1325 03/10/18 1038 03/11/18 0406  NA 136 137 138  K 4.0 4.1 3.6  CL 102  --  103  CO2 24  --  27  GLUCOSE 95 123* 124*  BUN 16  --  10  CREATININE 1.01  --  0.97  CALCIUM 8.7*  --  8.2*    PT/INR:  Recent Labs    03/09/18 1325  LABPROT 12.3  INR 0.93   ABG    Component Value Date/Time   PHART 7.426 03/11/2018 0354   HCO3 27.7 03/11/2018 0354   O2SAT 98.3 03/11/2018 0354   CBG (last 3)  No results for input(s): GLUCAP in the last 72 hours.  Assessment/Plan: S/P Procedure(s) (LRB): VIDEO ASSISTED THORACOSCOPY (Right) EXCISION OF BULLA (Right) Right Upper LOBECTOMY (Right) -POD # 1  Pain control adequate with toradol, PCA IS for atelectasis Keep CT to suction Anemia secondary to ABL- follow, young an  otherwise healthy should not need transfusion SCD + enoxaparin for DVT prophylaxis mobilize   LOS: 4 days    Loreli SlotSteven C Deaveon Schoen 03/11/2018

## 2018-03-12 ENCOUNTER — Inpatient Hospital Stay (HOSPITAL_COMMUNITY): Payer: BLUE CROSS/BLUE SHIELD

## 2018-03-12 LAB — CBC
HEMATOCRIT: 23.4 % — AB (ref 39.0–52.0)
Hemoglobin: 7.7 g/dL — ABNORMAL LOW (ref 13.0–17.0)
MCH: 31 pg (ref 26.0–34.0)
MCHC: 32.9 g/dL (ref 30.0–36.0)
MCV: 94.4 fL (ref 78.0–100.0)
Platelets: 201 10*3/uL (ref 150–400)
RBC: 2.48 MIL/uL — AB (ref 4.22–5.81)
RDW: 13.2 % (ref 11.5–15.5)
WBC: 8.7 10*3/uL (ref 4.0–10.5)

## 2018-03-12 LAB — COMPREHENSIVE METABOLIC PANEL
ALBUMIN: 3.1 g/dL — AB (ref 3.5–5.0)
ALK PHOS: 39 U/L (ref 38–126)
ALT: 16 U/L (ref 0–44)
ANION GAP: 7 (ref 5–15)
AST: 24 U/L (ref 15–41)
BILIRUBIN TOTAL: 0.5 mg/dL (ref 0.3–1.2)
BUN: 10 mg/dL (ref 6–20)
CO2: 26 mmol/L (ref 22–32)
CREATININE: 0.99 mg/dL (ref 0.61–1.24)
Calcium: 8.3 mg/dL — ABNORMAL LOW (ref 8.9–10.3)
Chloride: 104 mmol/L (ref 98–111)
GFR calc Af Amer: >60 mL/min (ref >60)
GFR calc non Af Amer: >60 mL/min (ref >60)
GLUCOSE: 126 mg/dL — AB (ref 70–99)
Potassium: 3.9 mmol/L (ref 3.5–5.1)
SODIUM: 137 mmol/L (ref 135–145)
TOTAL PROTEIN: 5.2 g/dL — AB (ref 6.5–8.1)

## 2018-03-12 MED ORDER — IPRATROPIUM-ALBUTEROL 0.5-2.5 (3) MG/3ML IN SOLN: 3.0000 mL | RESPIRATORY_TRACT | Status: DC | PRN

## 2018-03-12 NOTE — Progress Notes (Addendum)
      [address]       Troy Farley,[place] [id number]             [phone]      2 Days Post-Op Procedure(s) (LRB): VIDEO ASSISTED THORACOSCOPY (Right) EXCISION OF BULLA (Right) Right Upper LOBECTOMY (Right)   Subjective:  No new complaints.  States pain is well controlled.  Up and getting ready to walk with staff.  Objective: Vital signs in last 24 hours: Temp:  [98.3 F (36.8 C)-99 F (37.2 C)] 98.7 F (37.1 C) (09/21 0759) Pulse Rate:  [80-93] 80 (09/21 0759) Cardiac Rhythm: Normal sinus rhythm (09/21 0800) Resp:  [14-21] 18 (09/21 0938) BP: (103-123)/(59-64) 123/61 (09/21 0759) SpO2:  [100 %] 100 % (09/21 0938)  Intake/Output from previous day: 09/20 0701 - 09/21 0700 In: 0  Out: 2500 [Urine:2200; Chest Tube:300] Intake/Output this shift: Total I/O In: 250 [P.O.:250] Out: 200 [Urine:200]  General appearance: alert, cooperative and no distress Heart: regular rate and rhythm Lungs: clear to auscultation bilaterally Abdomen: soft, non-tender; bowel sounds normal; no masses,  no organomegaly Extremities: extremities normal, atraumatic, no cyanosis or edema Wound: clean and dry  Lab Results: Recent Labs    03/11/18 0406 03/12/18 0233  WBC 10.2 8.7  HGB 8.4* 7.7*  HCT 25.2* 23.4*  PLT 221 201   BMET:  Recent Labs    03/11/18 0406 03/12/18 0233  NA 138 137  K 3.6 3.9  CL 103 104  CO2 27 26  GLUCOSE 124* 126*  BUN 10 10  CREATININE 0.97 0.99  CALCIUM 8.2* 8.3*    PT/INR:  Recent Labs    03/09/18 1325  LABPROT 12.3  INR 0.93   ABG    Component Value Date/Time   PHART 7.426 03/11/2018 0354   HCO3 27.7 03/11/2018 0354   O2SAT 98.3 03/11/2018 0354   CBG (last 3)  No results for input(s): GLUCAP in the last 72 hours.  Assessment/Plan: S/P Procedure(s) (LRB): VIDEO ASSISTED THORACOSCOPY (Right) EXCISION OF BULLA (Right) Right Upper LOBECTOMY (Right)  1. Chest tube- + air leak with cough, CXR with questionable residual anterior  pneumothorax that couldn't be excluded, will discuss further management with Troy Farley 2. CV- hemodynamically stable 3. Expected post operative blood loss anemia, hgb down to 7.7, chest tube output is reasonable, patient asymptomatic 4. Dispo- patient stable, leave chest tube to suction today 4.    LOS: 5 days    Troy Farley 03/12/2018  Watching hgb, hold on tranfusion Leave chest tube in  I have seen and examined [facility] and agree with the above assessment  and plan.  Troy OvensEdward B Anzel Kearse MD [facility] [phone] Office [phone] 03/12/2018 11:00 AM

## 2018-03-13 ENCOUNTER — Inpatient Hospital Stay (HOSPITAL_COMMUNITY): Payer: BLUE CROSS/BLUE SHIELD

## 2018-03-13 LAB — BPAM RBC
BLOOD PRODUCT EXPIRATION DATE: 201910072359
Blood Product Expiration Date: 201910072359
ISSUE DATE / TIME: 201909191048
ISSUE DATE / TIME: 201909191048
UNIT TYPE AND RH: 6200
UNIT TYPE AND RH: 6200

## 2018-03-13 LAB — TYPE AND SCREEN
ABO/RH(D): A POS
Antibody Screen: NEGATIVE
UNIT DIVISION: 0
Unit division: 0

## 2018-03-13 NOTE — Progress Notes (Signed)
Patient ID: Troy Farley, male   DOB: 06/24/1975, 42 y.o.   MRN: [id number] [facility] DAILY ICU PROGRESS NOTE                   [address]            [place] [id number]          [phone]   3 Days Post-Op Procedure(s) (LRB): VIDEO ASSISTED THORACOSCOPY (Right) EXCISION OF BULLA (Right) Right Upper LOBECTOMY (Right)  Total Length of Stay:  LOS: 6 days   Subjective: Alert, good respiratory effort  Objective: Vital signs in last 24 hours: Temp:  [98.5 F (36.9 C)-99.7 F (37.6 C)] 98.5 F (36.9 C) (09/22 0721) Pulse Rate:  [72-93] 72 (09/22 0721) Cardiac Rhythm: Normal sinus rhythm (09/22 0830) Resp:  [10-20] 10 (09/22 0721) BP: (98-119)/(64-78) 98/68 (09/22 0721) SpO2:  [98 %-100 %] 100 % (09/22 0721)  Filed Weights   03/07/18 2023  Weight: 64.1 kg    Weight change:    Hemodynamic parameters for last 24 hours:    Intake/Output from previous day: 09/21 0701 - 09/22 0700 In: 525 [P.O.:500; I.V.:25] Out: 1280 [Urine:1250; Chest Tube:30]  Intake/Output this shift: No intake/output data recorded.  Current Meds: Scheduled Meds: . acetaminophen  1,000 mg Oral Q6H   Or  . acetaminophen (TYLENOL) oral liquid 160 mg/5 mL  1,000 mg Oral Q6H  . bisacodyl  10 mg Oral Daily  . enoxaparin (LOVENOX) injection  40 mg Subcutaneous Daily  . fentaNYL   Intravenous Q4H  . pantoprazole  40 mg Oral Daily  . senna-docusate  1 tablet Oral QHS   Continuous Infusions: . sodium chloride 10 mL/hr at 03/11/18 0832  . potassium chloride     PRN Meds:.diphenhydrAMINE **OR** diphenhydrAMINE, ipratropium-albuterol, naloxone **AND** sodium chloride flush, ondansetron (ZOFRAN) IV, oxyCODONE, potassium chloride, traMADol  General appearance: alert and cooperative Neurologic: intact Heart: regular rate and rhythm, S1, S2 normal, no murmur, click, rub or gallop Lungs: clear to auscultation bilaterally Abdomen: soft, non-tender; bowel sounds normal; no masses,  no  organomegaly Extremities: extremities normal, atraumatic, no cyanosis or edema and Homans sign is negative, no sign of DVT Wound: Chest tubes in place, small air leak from anterior chest tube  Lab Results: CBC: Recent Labs    03/11/18 0406 03/12/18 0233  WBC 10.2 8.7  HGB 8.4* 7.7*  HCT 25.2* 23.4*  PLT 221 201   BMET:  Recent Labs    03/11/18 0406 03/12/18 0233  NA 138 137  K 3.6 3.9  CL 103 104  CO2 27 26  GLUCOSE 124* 126*  BUN 10 10  CREATININE 0.97 0.99  CALCIUM 8.2* 8.3*    CMET: Lab Results  Component Value Date   WBC 8.7 03/12/2018   HGB 7.7 (L) 03/12/2018   HCT 23.4 (L) 03/12/2018   PLT 201 03/12/2018   GLUCOSE 126 (H) 03/12/2018   ALT 16 03/12/2018   AST 24 03/12/2018   NA 137 03/12/2018   K 3.9 03/12/2018   CL 104 03/12/2018   CREATININE 0.99 03/12/2018   BUN 10 03/12/2018   CO2 26 03/12/2018   INR 0.93 03/09/2018      PT/INR: No results for input(s): LABPROT, INR in the last 72 hours. Radiology: Dg Chest Port 1 View  Result Date: 03/13/2018 CLINICAL DATA:  Pneumothorax EXAM: PORTABLE CHEST 1 VIEW COMPARISON:  03/12/2018 FINDINGS: Postsurgical changes in the right hemithorax. Two right chest tubes are present. No pneumothorax is seen.  Left lung is clear.  No pleural effusion. The heart is normal in size. IMPRESSION: Postsurgical changes in the right hemithorax. No pneumothorax is seen. Two right chest tubes are present. Electronically Signed   By: Charline Bills M.D.   On: 03/13/2018 07:43     Assessment/Plan: S/P Procedure(s) (LRB): VIDEO ASSISTED THORACOSCOPY (Right) EXCISION OF BULLA (Right) Right Upper LOBECTOMY (Right) Mobilize Small output from chest tubes, no bleeding small air leak from the anterior tube placed on Troy Farley 03/13/2018 9:42 AM

## 2018-03-14 ENCOUNTER — Inpatient Hospital Stay (HOSPITAL_COMMUNITY): Payer: BLUE CROSS/BLUE SHIELD

## 2018-03-14 LAB — BASIC METABOLIC PANEL
Anion gap: 9 (ref 5–15)
BUN: 13 mg/dL (ref 6–20)
CO2: 29 mmol/L (ref 22–32)
Calcium: 8.6 mg/dL — ABNORMAL LOW (ref 8.9–10.3)
Chloride: 98 mmol/L (ref 98–111)
Creatinine, Ser: 0.95 mg/dL (ref 0.61–1.24)
GFR calc Af Amer: >60 mL/min (ref >60)
GFR calc non Af Amer: >60 mL/min (ref >60)
Glucose, Bld: 109 mg/dL — ABNORMAL HIGH (ref 70–99)
Potassium: 3.9 mmol/L (ref 3.5–5.1)
Sodium: 136 mmol/L (ref 135–145)

## 2018-03-14 LAB — CBC
HEMATOCRIT: 24 % — AB (ref 39.0–52.0)
HEMOGLOBIN: 7.9 g/dL — AB (ref 13.0–17.0)
MCH: 31.1 pg (ref 26.0–34.0)
MCHC: 32.9 g/dL (ref 30.0–36.0)
MCV: 94.5 fL (ref 78.0–100.0)
Platelets: 320 10*3/uL (ref 150–400)
RBC: 2.54 MIL/uL — AB (ref 4.22–5.81)
RDW: 13.2 % (ref 11.5–15.5)
WBC: 4.9 10*3/uL (ref 4.0–10.5)

## 2018-03-14 MED ORDER — POTASSIUM CHLORIDE CRYS ER 20 MEQ PO TBCR
20.0000 meq | EXTENDED_RELEASE_TABLET | Freq: Once | ORAL | Status: AC
  Administered 2018-03-14: 20 meq via ORAL
  Filled 2018-03-14: qty 1

## 2018-03-14 NOTE — Progress Notes (Addendum)
      [address]       [place] [id number]             [phone]       4 Days Post-Op Procedure(s) (LRB): VIDEO ASSISTED THORACOSCOPY (Right) EXCISION OF BULLA (Right) Right Upper LOBECTOMY (Right)  Subjective: Patient just waking up. He has no specific complaints this am.  Objective: Vital signs in last 24 hours: Temp:  [98 F (36.7 C)-98.8 F (37.1 C)] 98.1 F (36.7 C) (09/23 0400) Pulse Rate:  [74-88] 74 (09/23 0400) Cardiac Rhythm: Normal sinus rhythm (09/23 0524) Resp:  [12-19] 14 (09/23 0400) BP: (97-112)/(68-80) 97/68 (09/23 0400) SpO2:  [96 %-100 %] 99 % (09/23 0400) FiO2 (%):  [39 %-42 %] 39 % (09/22 1653)     Intake/Output from previous day: 09/22 0701 - 09/23 0700 In: 550 [P.O.:550] Out: 950 [Urine:850; Chest Tube:100]   Physical Exam:  Cardiovascular: RRR. Pulmonary: Clear to auscultation on left and diminished right apex Abdomen: Soft, non tender, bowel sounds present. Extremities: No lower extremity edema. Wounds: Dressing is clean and dry.  Chest Tubes:to water seal, +++ air leak with cough  Lab Results: CBC: Recent Labs    03/12/18 0233 03/14/18 0228  WBC 8.7 4.9  HGB 7.7* 7.9*  HCT 23.4* 24.0*  PLT 201 320   BMET:  Recent Labs    03/12/18 0233 03/14/18 0228  NA 137 136  K 3.9 3.9  CL 104 98  CO2 26 29  GLUCOSE 126* 109*  BUN 10 13  CREATININE 0.99 0.95  CALCIUM 8.3* 8.6*    PT/INR: No results for input(s): LABPROT, INR in the last 72 hours. ABG:  INR: Will add last result for INR, ABG once components are confirmed Will add last 4 CBG results once components are confirmed  Assessment/Plan:  1. CV - SR in the 70-80's. 2.  Pulmonary - Chest tubes with 100 cc last 24 hours. Chest tube is to water seal and there is an air leak with cough. CXR this am appears to show right pneumothorax. As discussed with Dr. Dorris FetchHendrickson, remove posterior chest tube and leave remaining tube to water seal. Encourage  incentive spirometer. 3. Anemia-H and H slightly increased to 7.9 and 24 4. Supplement potassium  Donielle M ZimmermanPA-C 03/14/2018,7:28 AM [phone]  Patient seen and examined, agree with above Will dc posterior CT Leave anterior CT to water seal for now  [place] MariaSteven C. Dorris FetchHendrickson, MD [facility] [phone]

## 2018-03-14 NOTE — Plan of Care (Signed)

## 2018-03-14 NOTE — Progress Notes (Signed)
Pt walked to front entrance [employer](north tower) and returned back to room. Tolerated it well.

## 2018-03-15 ENCOUNTER — Inpatient Hospital Stay (HOSPITAL_COMMUNITY): Payer: BLUE CROSS/BLUE SHIELD

## 2018-03-15 NOTE — Plan of Care (Signed)

## 2018-03-15 NOTE — Progress Notes (Addendum)
      [address]       [place] [id number]             [phone]       5 Days Post-Op Procedure(s) (LRB): VIDEO ASSISTED THORACOSCOPY (Right) EXCISION OF BULLA (Right) Right Upper LOBECTOMY (Right)  Subjective: Patient with complaints of "IV on the right forearm is itching"  Objective: Vital signs in last 24 hours: Temp:  [98 F (36.7 C)-98.7 F (37.1 C)] 98.3 F (36.8 C) (09/24 0744) Pulse Rate:  [73-88] 73 (09/24 0744) Cardiac Rhythm: Normal sinus rhythm (09/24 0117) Resp:  [10-20] 10 (09/24 0744) BP: (102-112)/(64-81) 107/78 (09/24 0744) SpO2:  [98 %-100 %] 98 % (09/24 0744)     Intake/Output from previous day: 09/23 0701 - 09/24 0700 In: 1320 [P.O.:1320] Out: 870 [Urine:850; Chest Tube:20]   Physical Exam:  Cardiovascular: RRR. Pulmonary: Clear to auscultation on left and slightly diminished right apex Abdomen: Soft, non tender, bowel sounds present. Extremities: No lower extremity edema. Wounds: Dressing is clean and dry.  Chest Tubes:to water seal, +++ air leak with cough  Lab Results: CBC: Recent Labs    03/14/18 0228  WBC 4.9  HGB 7.9*  HCT 24.0*  PLT 320   BMET:  Recent Labs    03/14/18 0228  NA 136  K 3.9  CL 98  CO2 29  GLUCOSE 109*  BUN 13  CREATININE 0.95  CALCIUM 8.6*    PT/INR: No results for input(s): LABPROT, INR in the last 72 hours. ABG:  INR: Will add last result for INR, ABG once components are confirmed Will add last 4 CBG results once components are confirmed  Assessment/Plan:  1. CV - SR in the 70-80's. 2.  Pulmonary - Chest tube with 20 cc last 24 hours. Chest tube is to water seal and there is an air leak with cough. CXR this am appears to show diminished right pneumothorax.  Hope to remove remaining chest tube soon once air leak resolves. Encourage incentive spirometer. 3. Anemia-H and H slightly increased to 7.9 and 24 4. Supplement potassium  Troy M ZimmermanPA-C 03/15/2018,7:45  AM [phone]  CXR improved. There is still an air leak, but he has a lot of tidal movement in tube.   Ambulate  Troy DecentSteven C. Dorris FetchHendrickson, MD [facility] [phone]

## 2018-03-16 ENCOUNTER — Inpatient Hospital Stay (HOSPITAL_COMMUNITY): Payer: BLUE CROSS/BLUE SHIELD

## 2018-03-16 NOTE — Progress Notes (Signed)
Patient refused to ambulate overnight despite education.

## 2018-03-16 NOTE — Plan of Care (Signed)

## 2018-03-16 NOTE — Progress Notes (Signed)
Pleuravac changed. Continue to monitor.

## 2018-03-16 NOTE — Progress Notes (Signed)
6 Days Post-Op Procedure(s) (LRB): VIDEO ASSISTED THORACOSCOPY (Right) EXCISION OF BULLA (Right) Right Upper LOBECTOMY (Right) Subjective: Some incisional pain- worse in morning, not bad rest of day  Objective: Vital signs in last 24 hours: Temp:  [98.6 F (37 C)-98.9 F (37.2 C)] 98.6 F (37 C) (09/25 0326) Pulse Rate:  [73-99] 80 (09/25 0326) Cardiac Rhythm: Normal sinus rhythm (09/25 0733) Resp:  [12-18] 17 (09/25 0733) BP: (103-117)/(63-75) 110/72 (09/25 0326) SpO2:  [94 %-100 %] 97 % (09/25 0733)  Hemodynamic parameters for last 24 hours:    Intake/Output from previous day: 09/24 0701 - 09/25 0700 In: 10 [I.V.:10] Out: 1505 [Urine:1475; Chest Tube:30] Intake/Output this shift: Total I/O In: -  Out: 300 [Urine:300]  General appearance: alert, cooperative and no distress Neurologic: intact Heart: regular rate and rhythm Lungs: clear to auscultation bilaterally + air movement in tube, likely intermittent air leak  Lab Results: Recent Labs    03/14/18 0228  WBC 4.9  HGB 7.9*  HCT 24.0*  PLT 320   BMET:  Recent Labs    03/14/18 0228  NA 136  K 3.9  CL 98  CO2 29  GLUCOSE 109*  BUN 13  CREATININE 0.95  CALCIUM 8.6*    PT/INR: No results for input(s): LABPROT, INR in the last 72 hours. ABG    Component Value Date/Time   PHART 7.426 03/11/2018 0354   HCO3 27.7 03/11/2018 0354   O2SAT 98.3 03/11/2018 0354   CBG (last 3)  No results for input(s): GLUCAP in the last 72 hours.  Assessment/Plan: S/P Procedure(s) (LRB): VIDEO ASSISTED THORACOSCOPY (Right) EXCISION OF BULLA (Right) Right Upper LOBECTOMY (Right) -Overall doing well CXR unchanged apical space There is extreme tidal movement in tube with space. Difficult to tell but there does appear to be a small air leak. Pleuravac has been tipped over, will change out for a new one    LOS: 9 days    Loreli Slot 03/16/2018

## 2018-03-17 ENCOUNTER — Inpatient Hospital Stay (HOSPITAL_COMMUNITY): Payer: BLUE CROSS/BLUE SHIELD

## 2018-03-17 MED ORDER — FENTANYL 40 MCG/ML IV SOLN
INTRAVENOUS | Status: AC
  Filled 2018-03-17: qty 25

## 2018-03-17 NOTE — Plan of Care (Signed)
  Problem: Education: Goal: Knowledge of General Education information will improve Description Including pain rating scale, medication(s)/side effects and non-pharmacologic comfort measures Outcome: Adequate for Discharge   Problem: Health Behavior/Discharge Planning: Goal: Ability to manage health-related needs will improve Outcome: Adequate for Discharge   Problem: Activity: Goal: Risk for activity intolerance will decrease Outcome: Adequate for Discharge   Problem: Nutrition: Goal: Adequate nutrition will be maintained Outcome: Adequate for Discharge   Problem: Coping: Goal: Level of anxiety will decrease Outcome: Adequate for Discharge   Problem: Safety: Goal: Ability to remain free from injury will improve Outcome: Adequate for Discharge   Problem: Skin Integrity: Goal: Risk for impaired skin integrity will decrease Outcome: Adequate for Discharge

## 2018-03-17 NOTE — Progress Notes (Signed)
7 Days Post-Op Procedure(s) (LRB): VIDEO ASSISTED THORACOSCOPY (Right) EXCISION OF BULLA (Right) Right Upper LOBECTOMY (Right) Subjective: No complaint this AM  Objective: Vital signs in last 24 hours: Temp:  [97.9 F (36.6 C)-98.7 F (37.1 C)] 98.6 F (37 C) (09/26 0300) Pulse Rate:  [86-107] 86 (09/26 0300) Cardiac Rhythm: Sinus tachycardia (09/25 2145) Resp:  [2-20] 18 (09/26 0748) BP: (100-113)/(62-78) 100/62 (09/26 0300) SpO2:  [94 %-100 %] 98 % (09/26 0748) FiO2 (%):  [0 %] 0 % (09/25 1715)  Hemodynamic parameters for last 24 hours:    Intake/Output from previous day: 09/25 0701 - 09/26 0700 In: -  Out: 937 [Urine:925; Chest Tube:12] Intake/Output this shift: No intake/output data recorded.  General appearance: alert and cooperative Neurologic: intact Heart: regular rate and rhythm Lungs: clear to auscultation bilaterally Wound: clean Small air leak resolves with pressure at CT site  Lab Results: No results for input(s): WBC, HGB, HCT, PLT in the last 72 hours. BMET: No results for input(s): NA, K, CL, CO2, GLUCOSE, BUN, CREATININE, CALCIUM in the last 72 hours.  PT/INR: No results for input(s): LABPROT, INR in the last 72 hours. ABG    Component Value Date/Time   PHART 7.426 03/11/2018 0354   HCO3 27.7 03/11/2018 0354   O2SAT 98.3 03/11/2018 0354   CBG (last 3)  No results for input(s): GLUCAP in the last 72 hours.  Assessment/Plan: S/P Procedure(s) (LRB): VIDEO ASSISTED THORACOSCOPY (Right) EXCISION OF BULLA (Right) Right Upper LOBECTOMY (Right) -Stable overall His air leak this AM is intermittent and I can hear air entraining around chest tube. Stopped when pressure held at site Will change dressing and use occlusive gauze Clamp CT and repeat CXR later this AM   LOS: 10 days    Troy Farley 03/17/2018

## 2018-03-17 NOTE — Progress Notes (Addendum)
      [address]       [place] [address]             [phone]      Tolerated tube being clamped for 5 hours. CXR shows a very slight increase in pneumo, but I think it is mostly positioning (later CXR is much more upright) Unclamped- still tidal movement but no air leak after occlusive dressing placed at CT site D/w patient. I think we are safe to remove CT, but there is a small chance we will have to place a new tube  Viviann Spare C. Dorris Fetch, MD [facility] [phone]

## 2018-03-17 NOTE — Progress Notes (Signed)
      [address]       [place] [address]             [phone]      7 Days Post-Op Procedure(s) (LRB): VIDEO ASSISTED THORACOSCOPY (Right) EXCISION OF BULLA (Right) Right Upper LOBECTOMY (Right) Subjective: Feels ok, no specific c/o. Not SOB  Objective: Vital signs in last 24 hours: Temp:  [97.9 F (36.6 C)-98.7 F (37.1 C)] 98.6 F (37 C) (09/26 0300) Pulse Rate:  [86-107] 86 (09/26 0300) Cardiac Rhythm: Sinus tachycardia (09/25 2145) Resp:  [2-20] 18 (09/26 0748) BP: (100-113)/(62-78) 100/62 (09/26 0300) SpO2:  [94 %-100 %] 98 % (09/26 0748) FiO2 (%):  [0 %] 0 % (09/25 1715)  Hemodynamic parameters for last 24 hours:    Intake/Output from previous day: 09/25 0701 - 09/26 0700 In: -  Out: 937 [Urine:925; Chest Tube:12] Intake/Output this shift: No intake/output data recorded.  General appearance: alert, cooperative and no distress Heart: regular rate and rhythm Lungs: clear to auscultation bilaterally Abdomen: benign Extremities: no calf tenderness or edema Wound: incis healing well  Lab Results: No results for input(s): WBC, HGB, HCT, PLT in the last 72 hours. BMET: No results for input(s): NA, K, CL, CO2, GLUCOSE, BUN, CREATININE, CALCIUM in the last 72 hours.  PT/INR: No results for input(s): LABPROT, INR in the last 72 hours. ABG    Component Value Date/Time   PHART 7.426 03/11/2018 0354   HCO3 27.7 03/11/2018 0354   O2SAT 98.3 03/11/2018 0354   CBG (last 3)  No results for input(s): GLUCAP in the last 72 hours.  Meds Scheduled Meds: . bisacodyl  10 mg Oral Daily  . enoxaparin (LOVENOX) injection  40 mg Subcutaneous Daily  . fentaNYL   Intravenous Q4H  . pantoprazole  40 mg Oral Daily  . senna-docusate  1 tablet Oral QHS   Continuous Infusions: . sodium chloride 10 mL/hr at 03/11/18 0832  . potassium chloride     PRN Meds:.diphenhydrAMINE **OR** diphenhydrAMINE, ipratropium-albuterol, naloxone **AND** sodium chloride  flush, ondansetron (ZOFRAN) IV, oxyCODONE, potassium chloride, traMADol  Xrays Dg Chest Port 1 View  Result Date: 03/16/2018 CLINICAL DATA:  Follow-up right pneumothorax and chest tube. EXAM: PORTABLE CHEST 1 VIEW COMPARISON:  Yesterday. FINDINGS: A right chest tube remains in place. A right apical pneumothorax is again demonstrated without significant change. Right postsurgical changes are again noted. Clear left lung with left apical bullous changes again demonstrated. Normal sized heart. Mild scoliosis. IMPRESSION: Stable approximately 20% right apical pneumothorax with a chest tube in place. Electronically Signed   By: Beckie Salts M.D.   On: 03/16/2018 11:11    Assessment/Plan: S/P Procedure(s) (LRB): VIDEO ASSISTED THORACOSCOPY (Right) EXCISION OF BULLA (Right) Right Upper LOBECTOMY (Right)  1 doing well 2 tube now clamped per surgeon instruction, CXR later today and poss tube removal. Current CXR shows small pntx 3 hemodyn stable in sinus rhythm /Tachy 4 sats good on RA 5 no new labs  LOS: 10 days    Rowe Clack 03/17/2018 Pager [phone]

## 2018-03-18 ENCOUNTER — Inpatient Hospital Stay (HOSPITAL_COMMUNITY): Payer: BLUE CROSS/BLUE SHIELD

## 2018-03-18 MED ORDER — OXYCODONE HCL 5 MG PO TABS: ORAL_TABLET | ORAL | 0 refills | Status: DC

## 2018-03-18 NOTE — Progress Notes (Signed)
Patient discharged home, prescriptions and discharge information reviewed and provided to patient and family. IV removed, wheeled to vehicle.

## 2018-03-18 NOTE — Progress Notes (Signed)
Fentanyl PCA 13 ml wasted in the sink, witnessed by another Char[employer] fundraiser.

## 2018-03-18 NOTE — Progress Notes (Signed)
Xray called relayed chest x-ray result- slight increase in right side pneumothorax. PA called and claimed that she knew the result already.

## 2018-03-18 NOTE — Discharge Instructions (Signed)
1.May shower. 2.Clean wounds with mild soap and water.  Call the office at [phone] if any problems arise. 3. No strenuous activity until instructed otherwise 4. No driving while taking pain medication  Thoracoscopy, Care After Refer to this sheet in the next few weeks. These instructions provide you with information about caring for yourself after your procedure. Your health care provider may also give you more specific instructions. Your treatment has been planned according to current medical practices, but problems sometimes occur. Call your health care provider if you have any problems or questions after your procedure. What can I expect after the procedure? After your procedure, it is common to feel sore for up to two weeks. Follow these instructions at home:  There are many different ways to close and cover an incision, including stitches (sutures), skin glue, and adhesive strips. Follow your health care provider's instructions about: ? Incision care. ? Bandage (dressing) changes and removal. ? Incision closure removal.  Check your incision area every day for signs of infection. Watch for: ? Redness, swelling, or pain. ? Fluid, blood, or pus.  Take medicines only as directed by your health care provider.  Try to cough often. Coughing helps to protect against lung infection (pneumonia). It may hurt to cough. If this happens, hold a pillow against your chest when you cough.  Take deep breaths. This also helps to protect against pneumonia.  If you were given an incentive spirometer, use it as directed by your health care provider.  Do not take baths, swim, or use a hot tub until your health care provider approves. You may take showers.  Avoid lifting until your health care provider approves.  Avoid driving until your health care provider approves.  Do not travel by airplane after the chest tube is removed until your health care provider approves. Contact a health care  provider if:  You have a fever.  Pain medicines do not ease your pain.  You have redness, swelling, or increasing pain in your incision area.  You develop a cough that does not go away, or you are coughing up mucus that is yellow or green. Get help right away if:  You have fluid, blood, or pus coming from your incision.  There is a bad smell coming from your incision or dressing.  You develop a rash.  You have difficulty breathing.  You cough up blood.  You develop light-headedness or you feel faint.  You develop chest pain.  Your heartbeat feels irregular or very fast. This information is not intended to replace advice given to you by your health care provider. Make sure you discuss any questions you have with your health care provider. Document Released: 12/26/2004 Document Revised: 02/09/2016 Document Reviewed: 02/21/2014 Elsevier Interactive Patient Education  2018 [employer].   Pneumothorax A pneumothorax, commonly called a collapsed lung, is a condition in which air leaks from a lung and builds up in the space between the lung and the chest wall (pleural space). The air in a pneumothorax is trapped outside the lung and takes up space, preventing the lung from fully expanding. This is a condition that usually occurs suddenly. The buildup of air may be small or large. A small pneumothorax may go away on its own. When a pneumothorax is larger, it will often require medical treatment and hospitalization. What are the causes? A pneumothorax can sometimes happen quickly with no apparent cause. People with underlying lung problems, particularly COPD or emphysema, are at higher risk of  pneumothorax. However, pneumothorax can happen quickly even in people with no prior known lung problems. Trauma, surgery, medical procedures, or injury to the chest wall can also cause a pneumothorax. What are the signs or symptoms? Sometimes a pneumothorax will have no symptoms. When symptoms are  present, they can include:  Chest pain.  Shortness of breath.  Increased rate of breathing.  Bluish color to your lips or skin (cyanosis).  How is this diagnosed? Pneumothorax is usually diagnosed by a chest X-ray or chest CT scan. Your health care provider will also take a medical history and perform a physical exam to determine why you may have a pneumothorax. How is this treated? A small pneumothorax may go away on its own without treatment. Extra oxygen can sometimes help a small pneumothorax go away more quickly. For a larger pneumothorax or a pneumothorax that is causing symptoms, a procedure is usually needed to drain the air.In some cases, the health care provider may drain the air using a needle. In other cases, a chest tube may be inserted into the pleural space. A chest tube is a small tube placed between the ribs and into the pleural space. This removes the extra air and allows the lung to expand back to its normal size. A large pneumothorax will usually require a hospital stay. If there is ongoing air leakage into the pleural space, then the chest tube may need to remain in place for several days until the air leak has healed. In some cases, surgery may be needed. Follow these instructions at home:  Only take over-the-counter or prescription medicines as directed by your health care provider.  If a cough or pain makes it difficult for you to sleep at night, try sleeping in a semi-upright position in a recliner or by using 2 or 3 pillows.  Rest and limit activity as directed by your health care provider.  If you had a chest tube and it was removed, ask your health care provider when it is okay to remove the dressing. Until your health care provider says you can remove the dressing, do not allow it to get wet.  Do not smoke. Smoking is a risk factor for pneumothorax.  Do not fly in an airplane or scuba dive until your health care provider says it is okay.  Follow up with your  health care provider as directed. Get help right away if:  You have increasing chest pain or shortness of breath.  You have a cough that is not controlled with suppressants.  You begin coughing up blood.  You have pain that is getting worse or is not controlled with medicines.  You cough up thick, discolored mucus (sputum) that is yellow to green in color.  You have redness, increasing pain, or discharge at the site where a chest tube had been in place (if your pneumothorax was treated with a chest tube).  The site where your chest tube was located opens up.  You feel air coming out of the site where the chest tube was placed.  You have a fever or persistent symptoms for more than 2-3 days.  You have a fever and your symptoms suddenly get worse. This information is not intended to replace advice given to you by your health care provider. Make sure you discuss any questions you have with your health care provider. Document Released: 06/08/2005 Document Revised: 11/14/2015 Document Reviewed: 11/01/2013 Elsevier Interactive Patient Education  [employer].

## 2018-03-18 NOTE — Progress Notes (Addendum)
      [address]       [place] [address]             [phone]       8 Days Post-Op Procedure(s) (LRB): VIDEO ASSISTED THORACOSCOPY (Right) EXCISION OF BULLA (Right) Right Upper LOBECTOMY (Right)  Subjective: Patient sleeping-awakened. He is not real talkative this am.  Objective: Vital signs in last 24 hours: Temp:  [97.6 F (36.4 C)-98.9 F (37.2 C)] 97.7 F (36.5 C) (09/27 0810) Pulse Rate:  [79-99] 98 (09/27 0810) Cardiac Rhythm: Normal sinus rhythm (09/27 0803) Resp:  [16-21] 16 (09/27 0810) BP: (98-115)/(69-75) 109/72 (09/27 0810) SpO2:  [93 %-100 %] 98 % (09/27 0810)      Intake/Output from previous day: 09/26 0701 - 09/27 0700 In: 462 [P.O.:462] Out: 1350 [Urine:1350]   Physical Exam:  Cardiovascular: RRR Pulmonary: Clear to auscultation bilaterally Abdomen: Soft, non tender, bowel sounds present. Extremities: No lower extremity edema. Wound: Clean and dry.     Lab Results: CBC:No results for input(s): WBC, HGB, HCT, PLT in the last 72 hours. BMET: No results for input(s): NA, K, CL, CO2, GLUCOSE, BUN, CREATININE, CALCIUM in the last 72 hours.  PT/INR: No results for input(s): LABPROT, INR in the last 72 hours. ABG:  INR: Will add last result for INR, ABG once components are confirmed Will add last 4 CBG results once components are confirmed  Assessment/Plan:  1. CV - SR 2.  Pulmonary - Chest tube pulled yesterday. On room air. Await this am's CXR. 3. Possibly discharge later today if CXR stable   Donielle M ZimmermanPA-C 03/18/2018,8:20 AM [phone]  Patient seen and examined, agree with above No issues after CT removed CXR not significantly changed when taking difference in technique into account Will dc home today F/u in 2 weeks  Viviann Spare C. Dorris Fetch, MD [facility] [phone]

## 2018-03-28 ENCOUNTER — Other Ambulatory Visit: Payer: Self-pay | Admitting: Thoracic Surgery (Cardiothoracic Vascular Surgery)

## 2018-03-28 DIAGNOSIS — J439 Emphysema, unspecified: Secondary | ICD-10-CM

## 2018-03-29 ENCOUNTER — Encounter: Payer: Self-pay | Admitting: Thoracic Surgery (Cardiothoracic Vascular Surgery)

## 2018-03-29 ENCOUNTER — Ambulatory Visit (INDEPENDENT_AMBULATORY_CARE_PROVIDER_SITE_OTHER): Payer: Self-pay | Admitting: Thoracic Surgery (Cardiothoracic Vascular Surgery)

## 2018-03-29 ENCOUNTER — Other Ambulatory Visit: Payer: Self-pay

## 2018-03-29 ENCOUNTER — Ambulatory Visit
Admission: RE | Admit: 2018-03-29 | Discharge: 2018-03-29 | Disposition: A | Discharge status: Home / Self Care | Payer: BLUE CROSS/BLUE SHIELD | Source: Ambulatory Visit | Attending: Thoracic Surgery (Cardiothoracic Vascular Surgery) | Admitting: Thoracic Surgery (Cardiothoracic Vascular Surgery)

## 2018-03-29 VITALS — BP 98/68 | HR 78 | Resp 16 | Ht 70.0 in | Wt 145.0 lb

## 2018-03-29 DIAGNOSIS — Z902 Acquired absence of lung [part of]: Secondary | ICD-10-CM

## 2018-03-29 DIAGNOSIS — J439 Emphysema, unspecified: Secondary | ICD-10-CM

## 2018-03-29 NOTE — Progress Notes (Signed)
    HPI:     No current outpatient medications on file.   No current facility-administered medications for this visit.     Physical Exam BP 98/68 (BP Location: Right Arm, Patient Position: Sitting, Cuff Size: Large)   Pulse 78   Resp 16   Ht 5\' 10"  (1.778 m)   Wt 145 lb (65.8 kg)   SpO2 98% Comment: ON RA  BMI 20.81 kg/m   Diagnostic Tests: CHEST - 2 VIEW  COMPARISON:  03/18/2018 and prior radiographs  FINDINGS: Cardiomediastinal silhouette is unchanged.  Heart size is normal.  Surgical changes within the UPPER RIGHT hemithorax/lung noted.  Lucency at the RIGHT lung apex is relatively unchanged which could be due to large bulla versus stable pneumothorax. The definite LATERAL pneumothorax is no longer visualized.  The LEFT lung is clear.  IMPRESSION: Unchanged appearance of the chest with surgical changes in the RIGHT UPPER chest/lung noted. Relatively unchanged RIGHT apical lucency may be due to large bulla versus pneumothorax.   Electronically Signed   By: Harmon Pier M.D.   On: 03/29/2018 13:04 Personally reviewed the chest x-ray.  There is still an apical space on the right  Impression: Troy Farley is a 42 year old man with a history of tobacco abuse who presented with a right spontaneous pneumothorax.  He had a massive bulla throughout the right lung particularly in the right upper lobe.  I attempted a bullectomy of the right upper lobe but there really was no functional lung left and what was still there had multiple air leaks, so I had to do a right upper lobectomy.  We also stapled blebs from the lower and middle lobes.  He is doing well at this time.  He had to take some pain medication at first but has not had to recently.  He has not had any respiratory problems.  He continues to smoke.  I emphasized the importance of stopping.  He does have significant bulla on the left as well.  Reviewed the CT which is with him to give him an idea of  how significant that was.  He should avoid lifting anything over 20 pounds for another 3 weeks.  Other than that his activities are unrestricted.  Plan: Stop smoking I will be happy to see Troy Farley back anytime the future if I can be of any further assistance with his care.  Loreli Slot, MD [facility] [phone]

## 2020-03-08 IMAGING — CR DG CHEST 2V
2 series · 2 of 2 positions shown · non-contrast
Comparison: None.

CLINICAL DATA: Cough with chest pain and shortness of breath

EXAM:
CHEST - 2 VIEW

[chest pa]
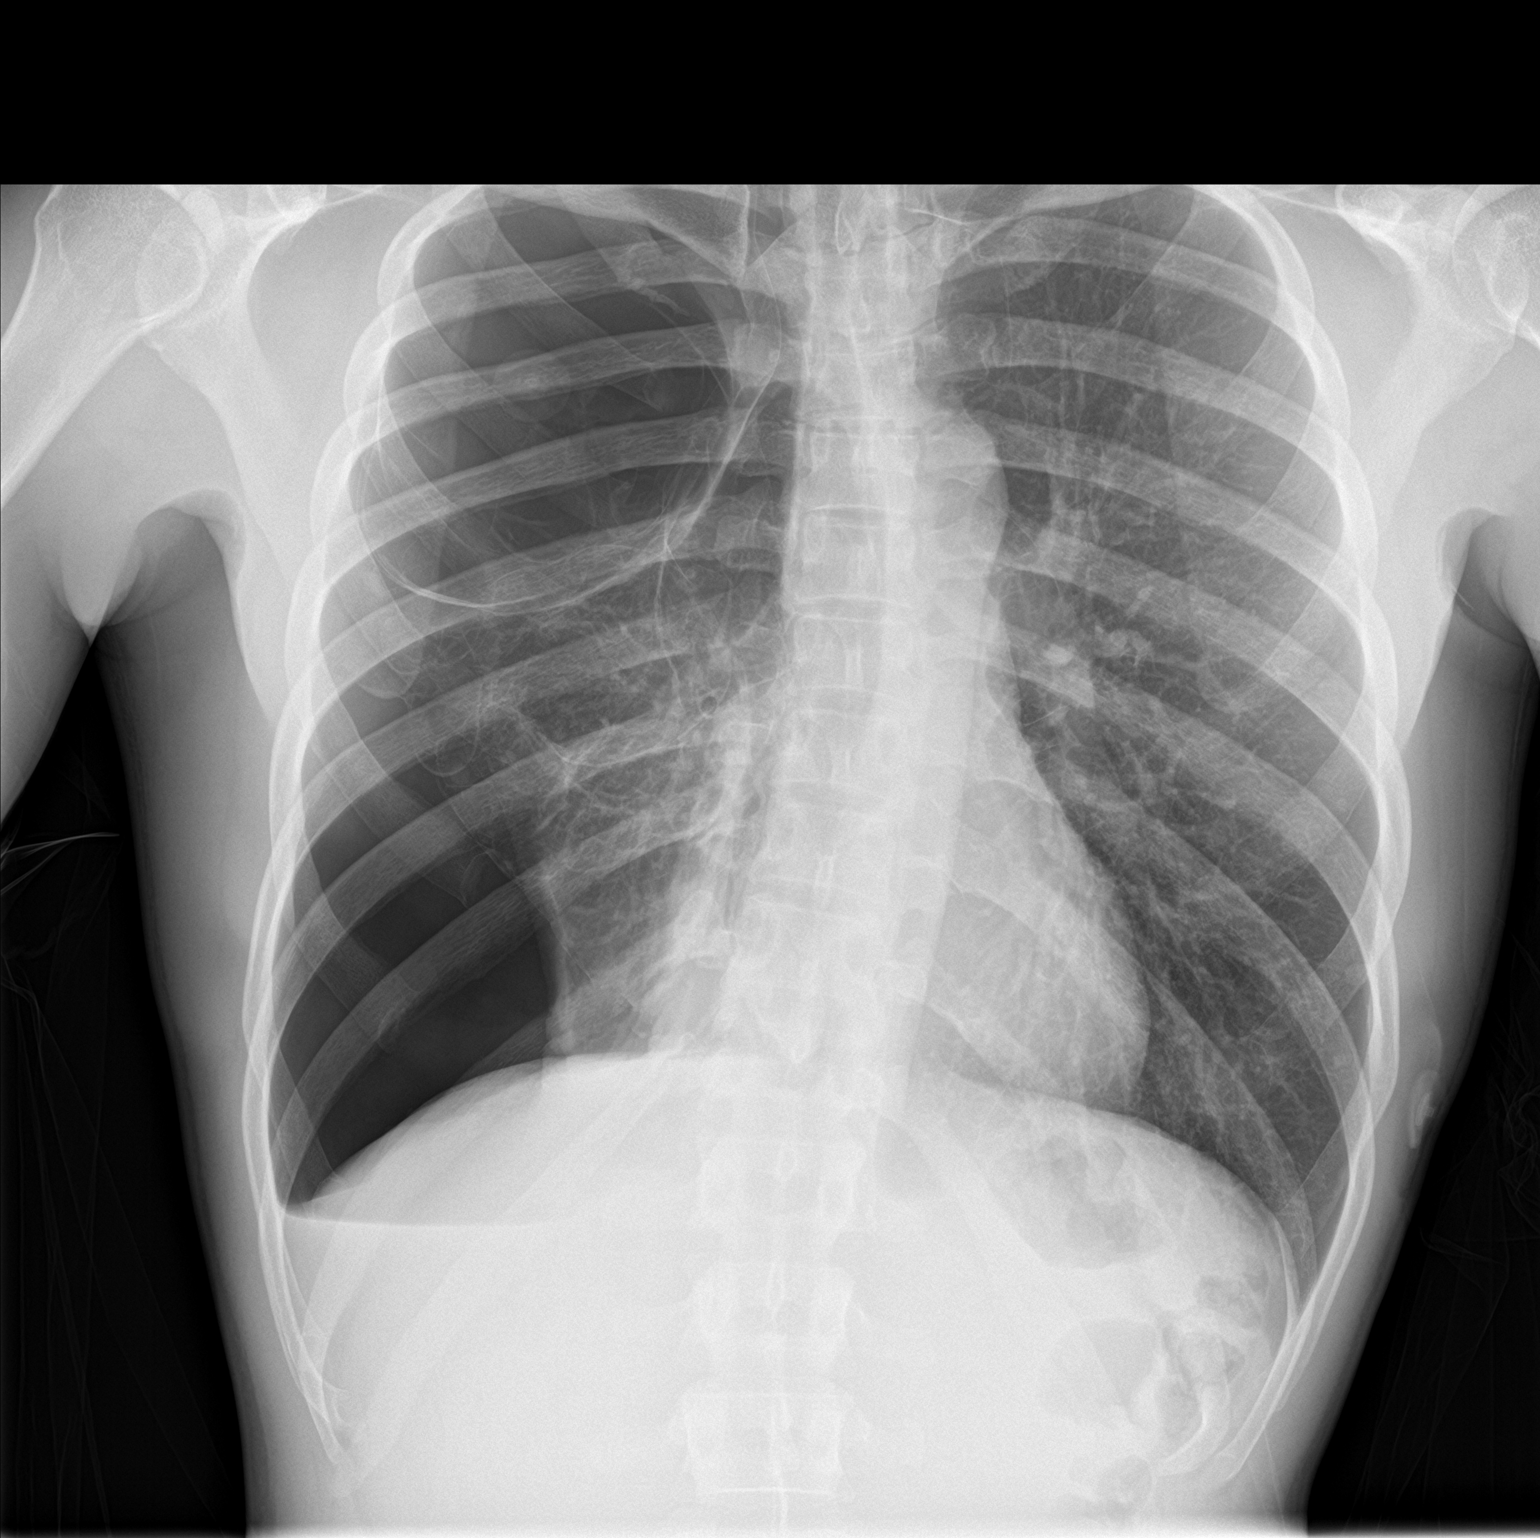

[chest lat]
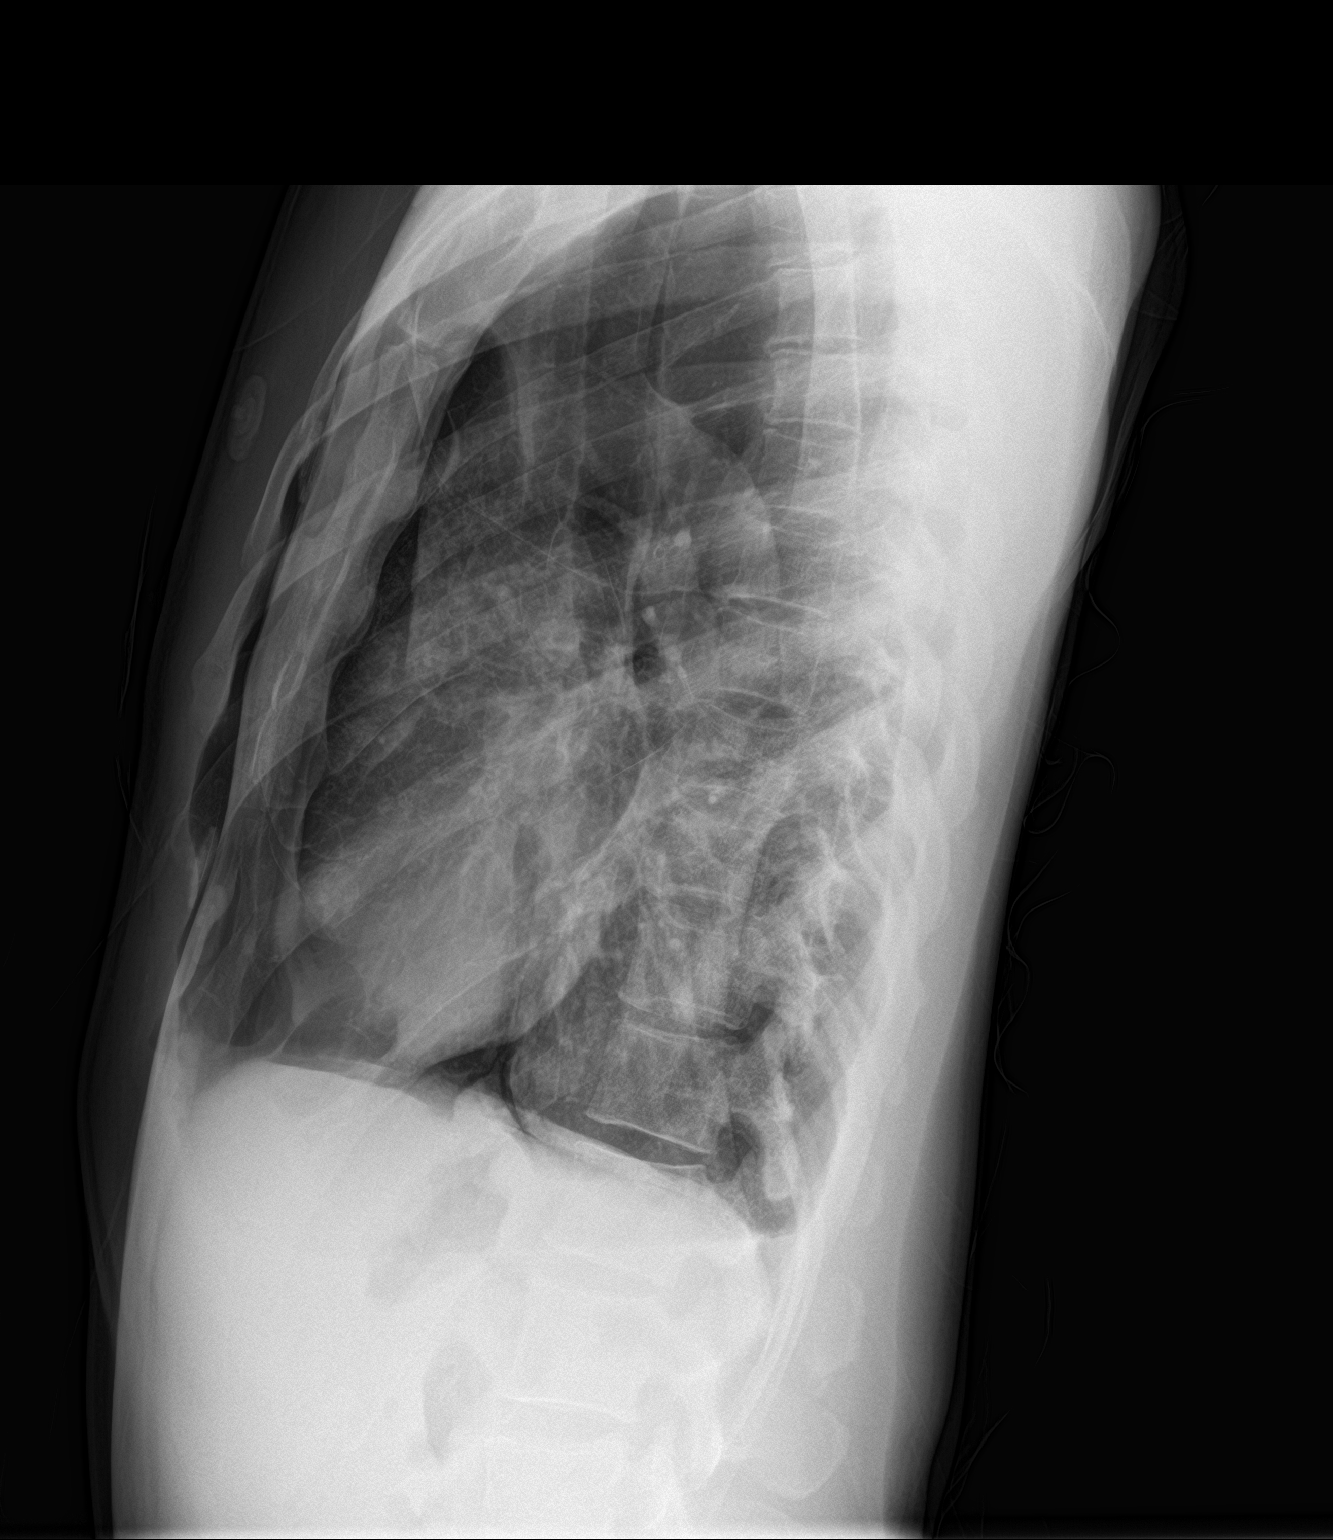

[2 of 2 positions shown; findings below may reference images not displayed]

FINDINGS: There is a sizable pneumothorax along the inferior and lateral
aspect of the right hemithorax. There is extensive bullous disease
on the left. Lucency in the upper lung region on the right likely is
due to bullous disease. There may be pneumothorax as well as bullous
disease superimposed on the right. There appears to be mild tension
component on the right.

The left lung is clear.

Heart size is normal. Pulmonary vascularity on the left is normal.
Pulmonary vascularity on the right is distorted. No adenopathy. No
evident bone lesions.
IMPRESSION: Pneumothorax on the right, most notably in the right inferior and
lateral regions with apparent mild tension component. There is
evidence of bullous disease on the right with lucency in the right
upper lobe largely if not entirely due to bullous disease. Given the
presence of pneumothorax and extensive bullous disease, it may be
prudent to consider noncontrast enhanced chest CT to further
evaluate, prior to chest tube placement.

Left lung clear.  Heart size normal.

Critical Value/emergent results were called by telephone at the time
of interpretation on 03/07/2018 at [DATE] to Dr. EB LEBEAU ,
who verbally acknowledged these results.

## 2020-03-08 IMAGING — CT CT CHEST W/O CM
2 of 4 series · 15 of 36 positions shown, 18 images · non-contrast
Comparison: 03/07/2018 chest radiograph

CLINICAL DATA: Pneumothorax, chest tube in place

EXAM:
CT CHEST WITHOUT CONTRAST
TECHNIQUE: Multidetector CT imaging of the chest was performed following the
standard protocol without IV contrast.

[Series 3: chest wo · axial · 0.62mm/px · z∈[+947,+1211]mm · 12 of 158 slices shown, 15 images]
[im 13/158  mediastinal]
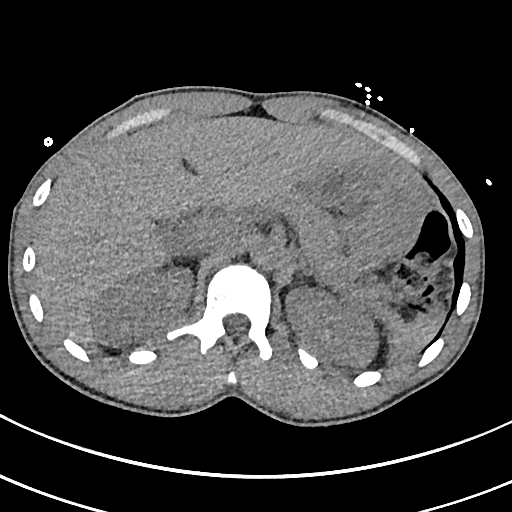
[im 13/158  lung]
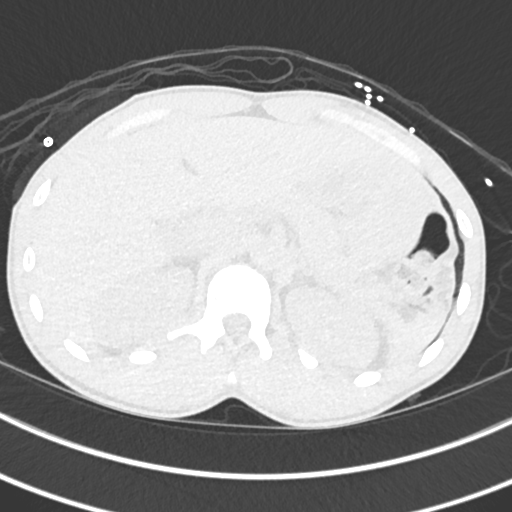
[im 25/158  lung]
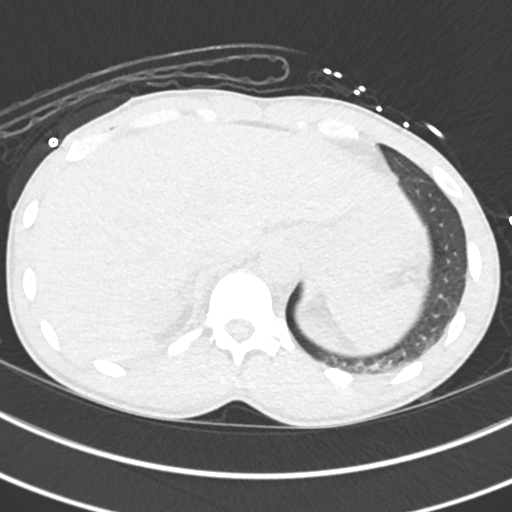
[im 37/158  lung]
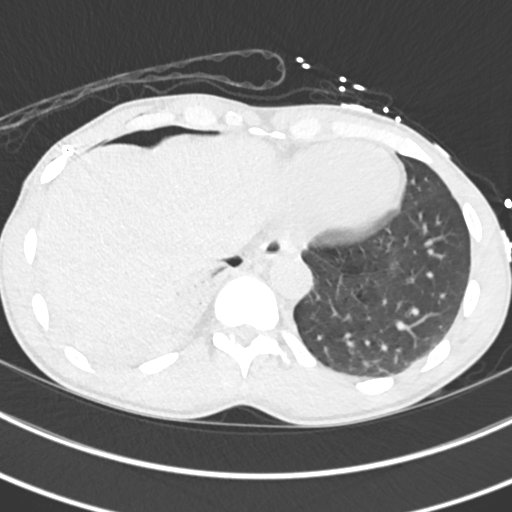
[im 49/158  lung]
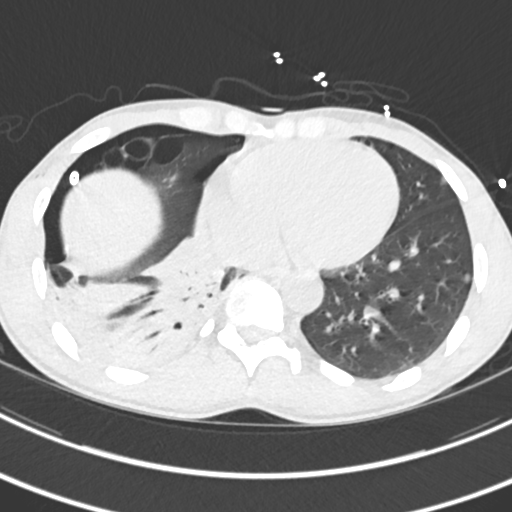
[im 61/158  mediastinal]
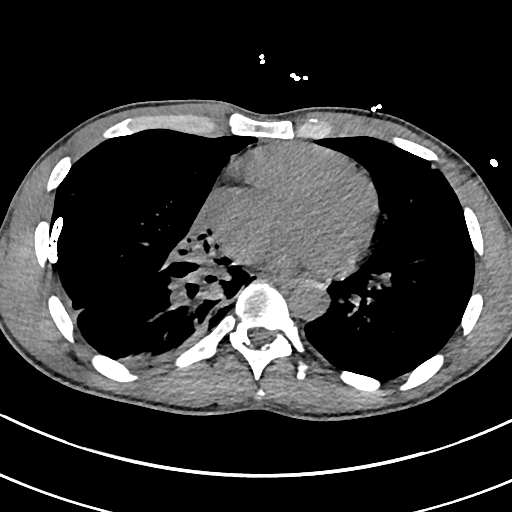
[im 61/158  lung]
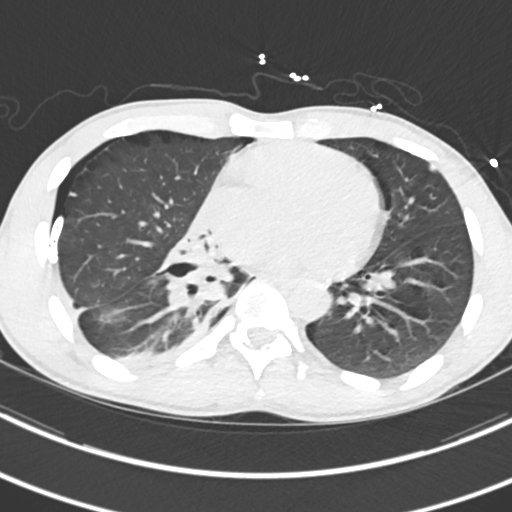
[im 73/158  lung]
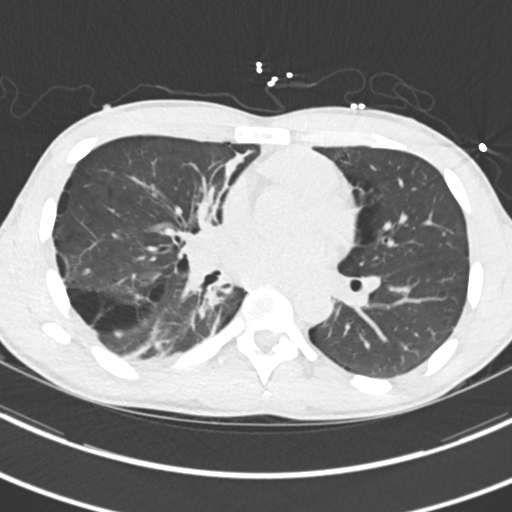
[im 85/158  lung]
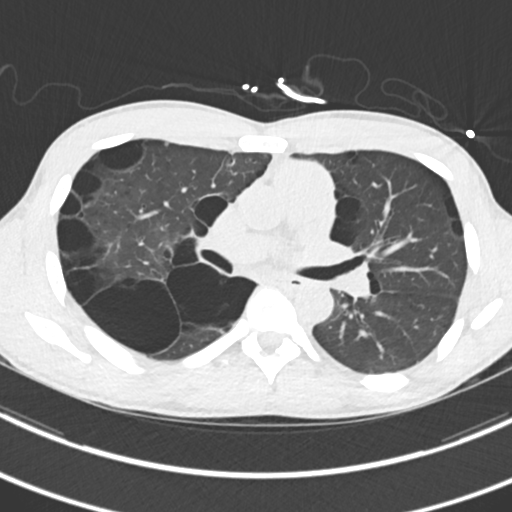
[im 97/158  lung]
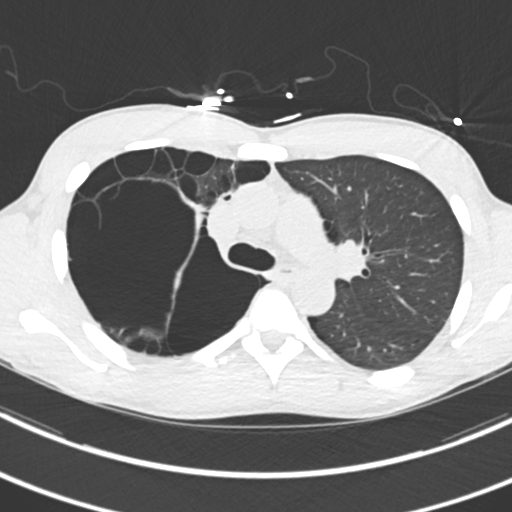
[im 109/158  mediastinal]
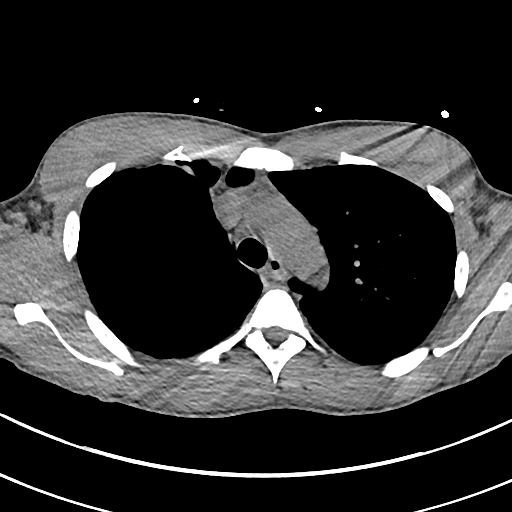
[im 109/158  lung]
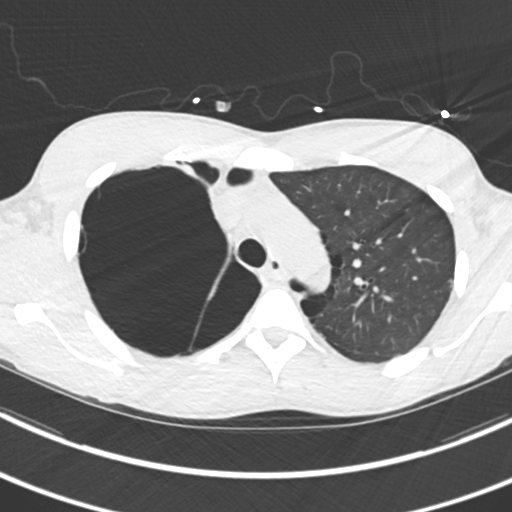
[im 121/158  lung]
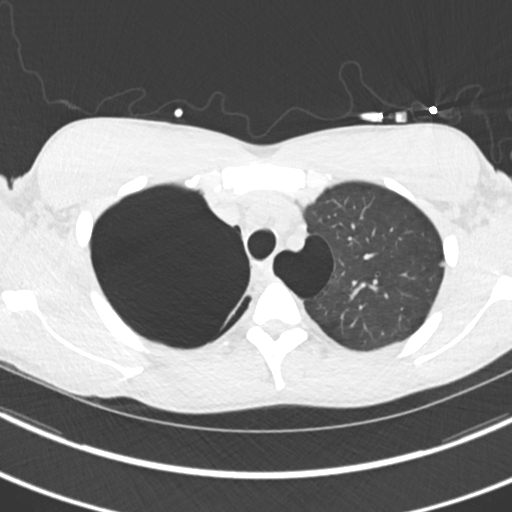
[im 133/158  lung]
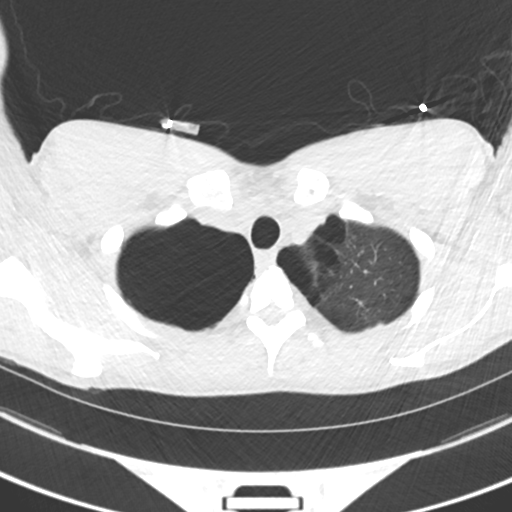
[im 145/158  lung]
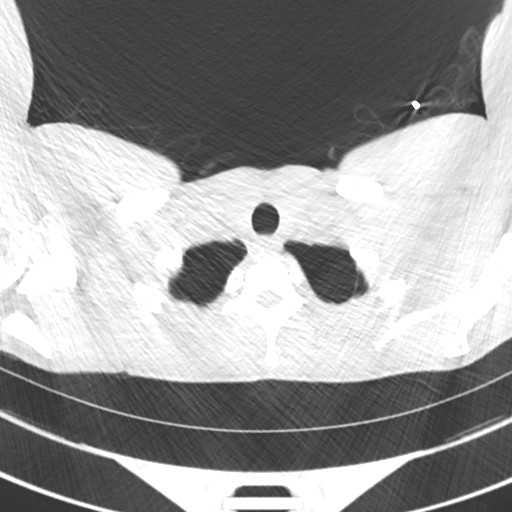

[Series 6: cor · coronal · 0.63mm/px · 3 of 98 slices shown]
[im 20/98  lung]
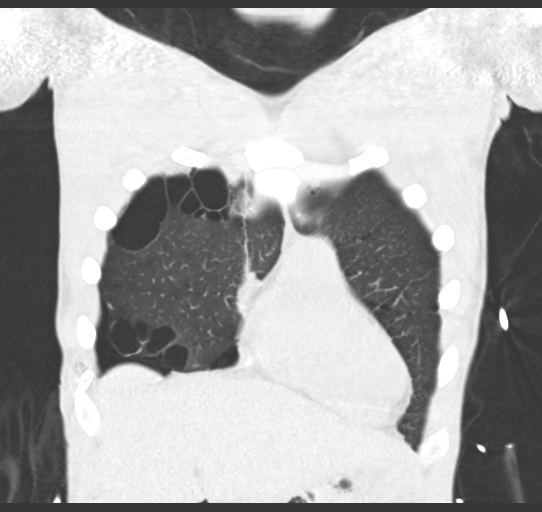
[im 39/98  lung]
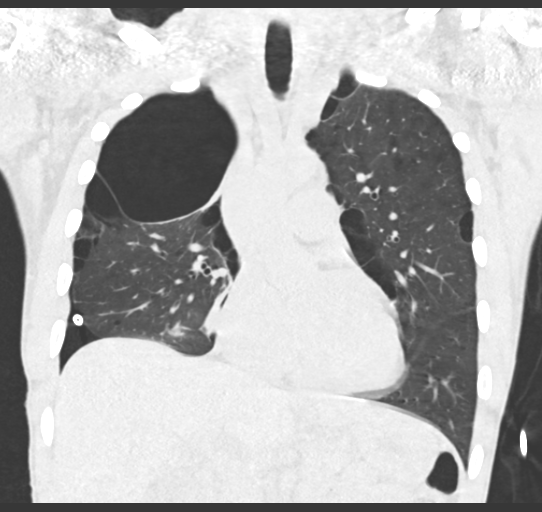
[im 59/98  lung]
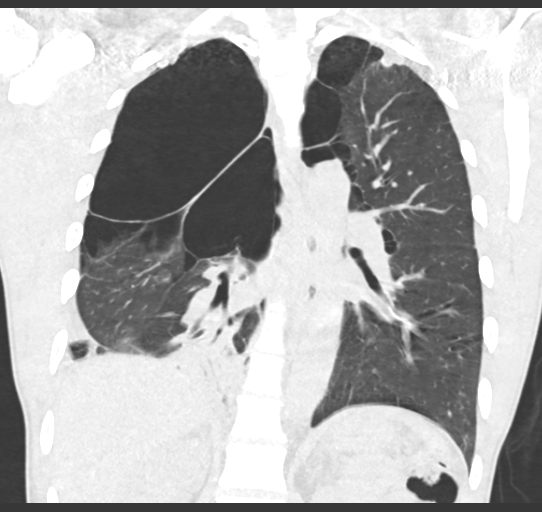

[15 of 36 positions shown; findings below may reference images not displayed]

FINDINGS: Cardiovascular: Unremarkable

Mediastinum/Nodes: Unremarkable

Lungs/Pleura: Striking primarily paraseptal emphysema, with a large
bulla on the right particularly at the right lung apex encompassing
most of the right hemithoracic volume.

There is a right pleural drainage catheter which is well positioned
peripherally at the right lung base and associated with a
approximately 5% right basilar pneumothorax.

There is complete atelectasis of the right middle lobe and subtotal
atelectasis of the right lower lobe, potentially with some degree of
consolidation also in the right lower lobe. I do not see a central
obstructing mass along the tracheobronchial tree.

There is also some mild atelectasis or scarring medially in the
right upper lobe.

There are approximately 13 scattered pulmonary nodules, most of
which are subpleural in location.. An index nodule in the left lower
lobe measures 0.8 by 0.7 cm; most of the nodules are smaller.

Upper Abdomen: Unremarkable

Musculoskeletal: Unremarkable
IMPRESSION: 1. There is an approximately 5% right basilar pneumothorax adjacent
to the pleural drainage catheter. Very prominent for age bullous
emphysema, primarily paraseptal. Emphysema ([id number]).
2. Complete atelectasis of the right middle lobe. Airspace opacity
in most of the right lower lobe, likely a combination of
consolidation and atelectasis. No obvious central obstructing mass
to account for these findings.
3. There are about 13 scattered pulmonary nodules, mostly subpleural
in location, measuring up to 8 mm in diameter. Non-contrast chest CT
at 3-6 months is recommended. If the nodules are stable at time of
repeat CT, then future CT at 18-24 months (from today's scan) is
considered optional for low-risk patients, but is recommended for
high-risk patients. This recommendation follows the consensus
statement: Guidelines for Management of Incidental Pulmonary Nodules
Detected on CT Images: From the [HOSPITAL] 1128; Radiology

## 2020-03-10 IMAGING — DX DG CHEST 1V PORT
1 series · 1 of 1 positions shown · non-contrast
Comparison: 03/07/2018

CLINICAL DATA: Pneumothorax, chest tube present

EXAM:
PORTABLE CHEST 1 VIEW

[chest]
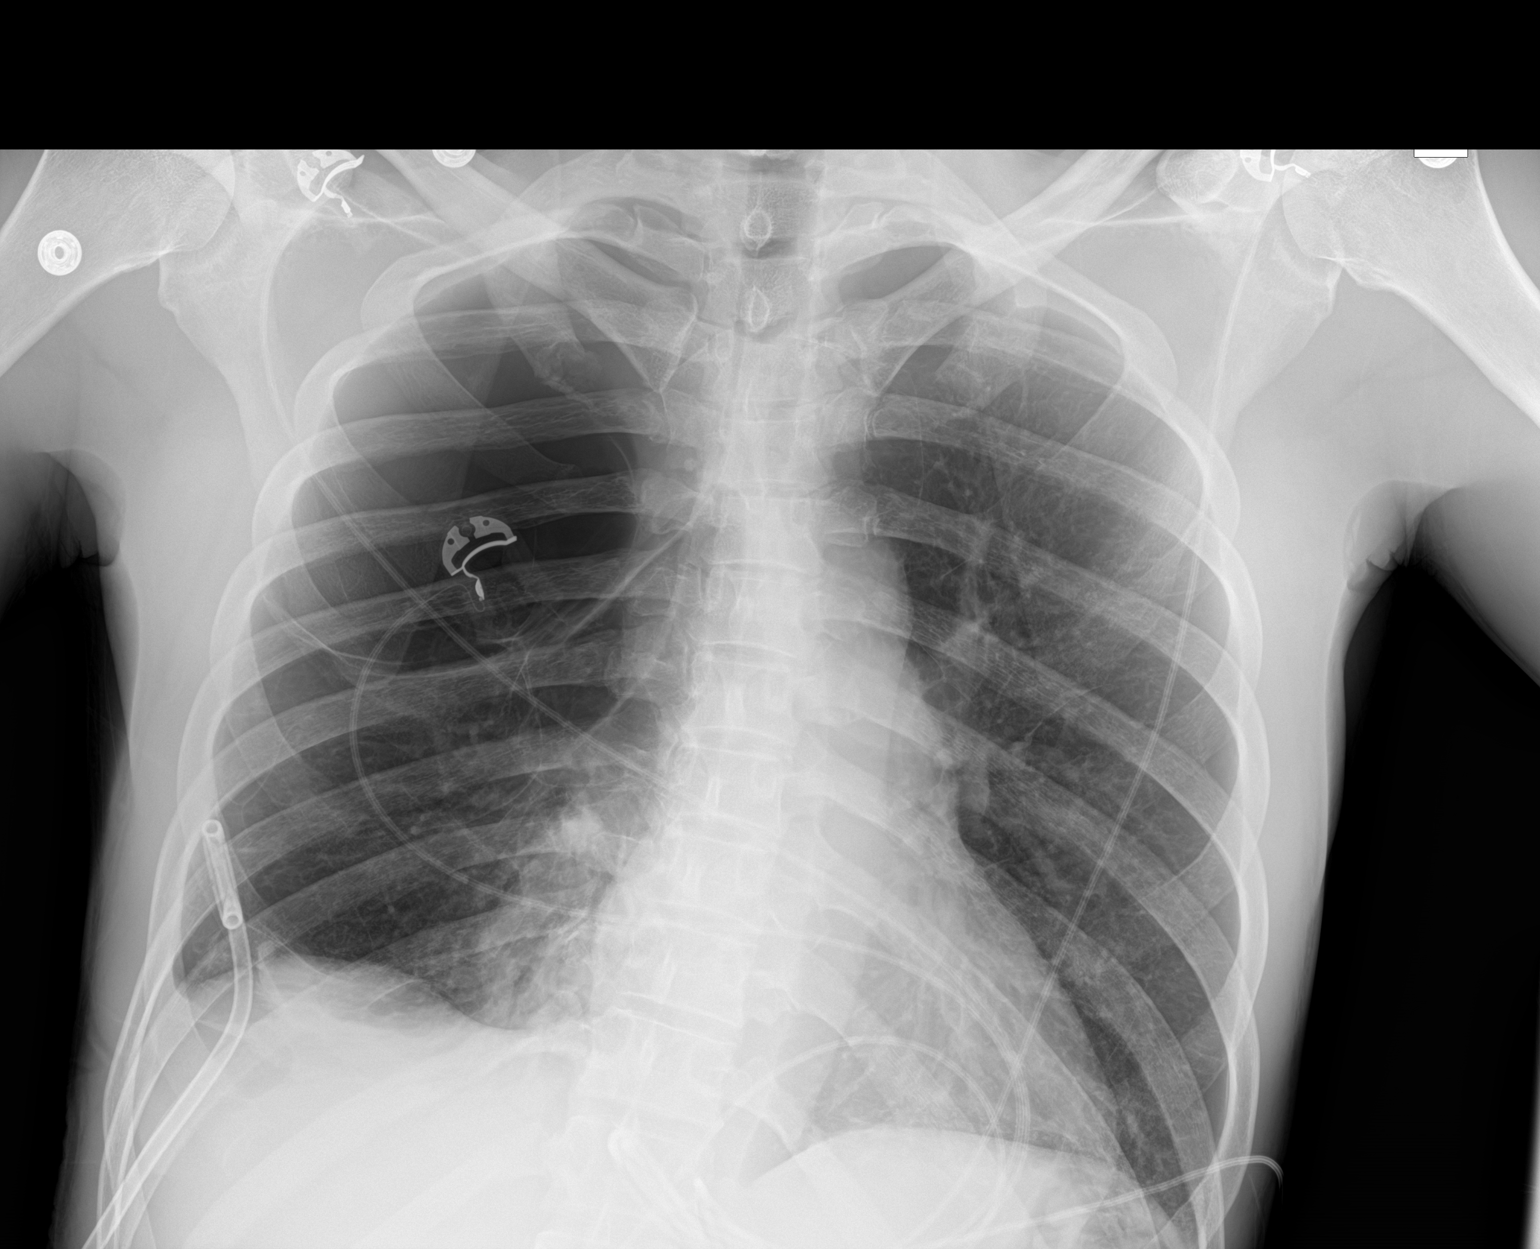

[1 of 1 positions shown; findings below may reference images not displayed]

FINDINGS: Bullous emphysema with a large right apical bulla noted. The
right-sided pleural drainage catheter remains in place. The previous
right basilar pneumothorax along the margins of the pleural drainage
catheter is no longer readily appreciable. There is some mild
atelectasis or scarring at the right lung base.

Cardiac and mediastinal margins appear normal.
IMPRESSION: 1. The previous right basilar pneumothorax is no longer appreciable
radiographically. The pleural drainage catheter remains in place.
2. Bullous emphysema.
3. Right lower lobe and right middle lobe airspace opacity persist.

## 2020-03-13 IMAGING — DX DG CHEST 1V PORT
1 series · 1 of 1 positions shown · non-contrast
Comparison: 03/11/2018 and earlier.

CLINICAL DATA: 42-year-old male postoperative day 2 status post
right VATS, right upper lobectomy and excision of bulla for
treatment of spontaneous pneumothorax.

EXAM:
PORTABLE CHEST 1 VIEW

[chest]
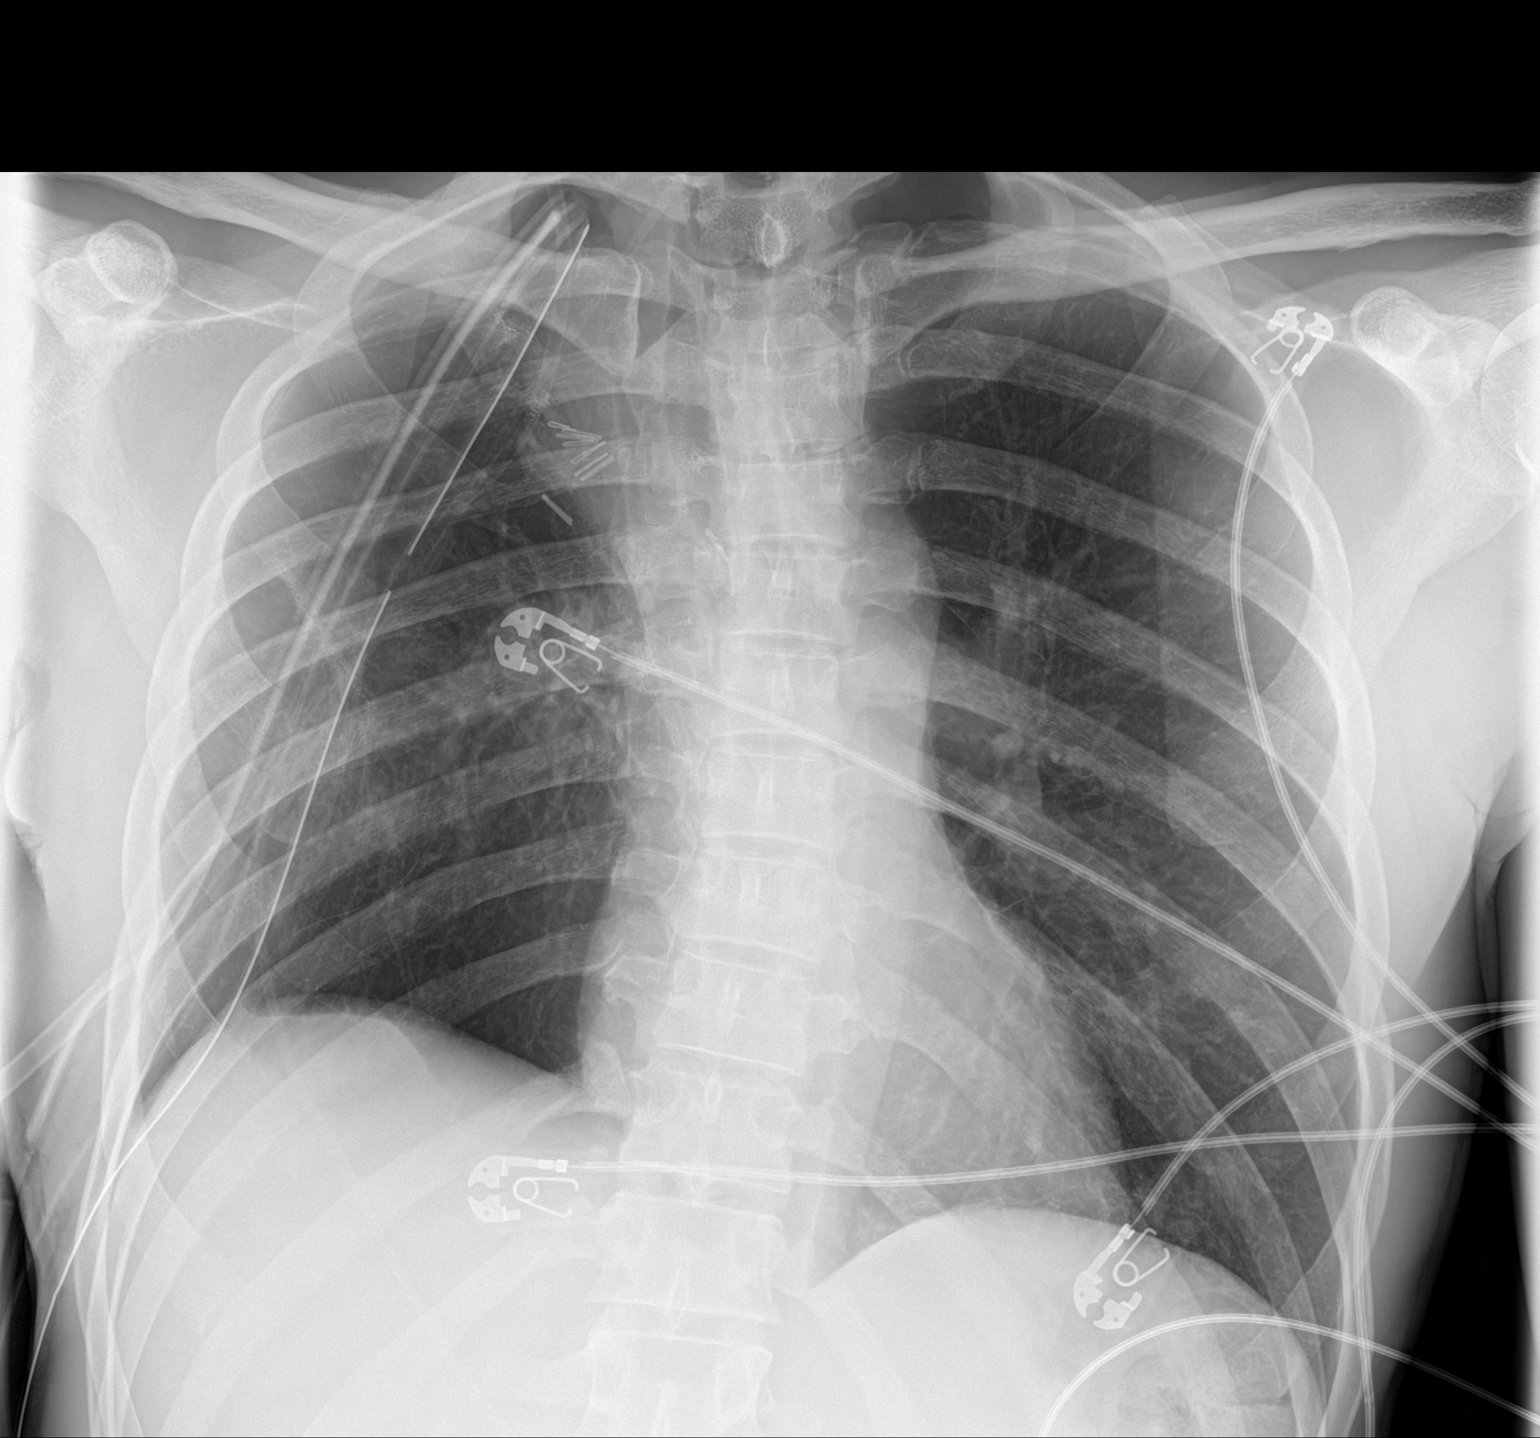

[1 of 1 positions shown; findings below may reference images not displayed]

FINDINGS: Portable AP semi upright view at 3831 hours. 2 right chest tubes are
stable. Postoperative changes to the right hilum and upper lung.
Asymmetric lucency in the right lung, including at the right
costophrenic angle lucency.

Stable lung volumes. No pulmonary edema, pleural effusion or
confluent pulmonary opacity. Stable cardiac size and mediastinal
contours. No acute osseous abnormality identified.
IMPRESSION: 1. Stable right chest tubes and postoperative changes to the right
lung.
2. Asymmetric lucency in the right hemithorax may be related to
combination of lobectomy and known bullous disease, but a residual
anterior pneumothorax is difficult to exclude. Repeat noncontrast
Chest CT as needed.
3. No new cardiopulmonary abnormality.
